# Patient Record
Sex: Male | Born: 1960 | Race: Black or African American | Hispanic: No | Smoking: Former smoker
Health system: Southern US, Community
[De-identification: ages and names within clinical notes are randomized; demographics above are authoritative.]

## PROBLEM LIST (undated history)

## (undated) DIAGNOSIS — Z92241 Personal history of systemic steroid therapy: Secondary | ICD-10-CM

## (undated) DIAGNOSIS — I10 Essential (primary) hypertension: Secondary | ICD-10-CM

## (undated) DIAGNOSIS — G4730 Sleep apnea, unspecified: Secondary | ICD-10-CM

## (undated) DIAGNOSIS — K219 Gastro-esophageal reflux disease without esophagitis: Secondary | ICD-10-CM

## (undated) HISTORY — PX: COLON SURGERY: SHX602

## (undated) HISTORY — PX: CIRCUMCISION: SUR203

---

## 2009-03-07 ENCOUNTER — Emergency Department (HOSPITAL_COMMUNITY): Admission: EM | Admit: 2009-03-07 | Discharge: 2009-03-07 | Payer: Self-pay | Admitting: Emergency Medicine

## 2009-04-15 ENCOUNTER — Emergency Department (HOSPITAL_COMMUNITY): Admission: EM | Admit: 2009-04-15 | Discharge: 2009-04-15 | Payer: Self-pay | Admitting: Emergency Medicine

## 2009-04-16 ENCOUNTER — Emergency Department (HOSPITAL_COMMUNITY): Admission: EM | Admit: 2009-04-16 | Discharge: 2009-04-16 | Payer: Self-pay | Admitting: Emergency Medicine

## 2010-07-28 LAB — COMPREHENSIVE METABOLIC PANEL
ALT: 31 U/L (ref 0–53)
AST: 27 U/L (ref 0–37)
Albumin: 3.5 g/dL (ref 3.5–5.2)
Calcium: 8.3 mg/dL — ABNORMAL LOW (ref 8.4–10.5)
Creatinine, Ser: 1.14 mg/dL (ref 0.4–1.5)
GFR calc Af Amer: >60 mL/min (ref >60)
GFR calc non Af Amer: >60 mL/min (ref >60)
Sodium: 137 mEq/L (ref 135–145)
Total Protein: 6.3 g/dL (ref 6.0–8.3)

## 2010-07-28 LAB — STREP A DNA PROBE: Group A Strep Probe: NEGATIVE

## 2010-07-28 LAB — DIFFERENTIAL
Eosinophils Relative: 0 % (ref 0–5)
Lymphocytes Relative: 4 % — ABNORMAL LOW (ref 12–46)
Lymphs Abs: 0.3 10*3/uL — ABNORMAL LOW (ref 0.7–4.0)
Monocytes Absolute: 0.9 10*3/uL (ref 0.1–1.0)
Monocytes Relative: 11 % (ref 3–12)

## 2010-07-28 LAB — CBC
MCHC: 34.6 g/dL (ref 30.0–36.0)
MCV: 84.6 fL (ref 78.0–100.0)
Platelets: 187 10*3/uL (ref 150–400)
RDW: 14.7 % (ref 11.5–15.5)

## 2010-07-30 LAB — URINALYSIS, ROUTINE W REFLEX MICROSCOPIC
Glucose, UA: NEGATIVE mg/dL
Hgb urine dipstick: NEGATIVE
Ketones, ur: NEGATIVE mg/dL
pH: 5.5 (ref 5.0–8.0)

## 2010-11-08 IMAGING — CR DG CHEST 2V
2 series · 2 of 2 positions shown · non-contrast
Comparison: 04/15/2009

CLINICAL DATA: Fever, diaphoresis, sore throat and tachycardia.
Pleuritic chest pain.

CHEST - 2 VIEW

[w chest pa]
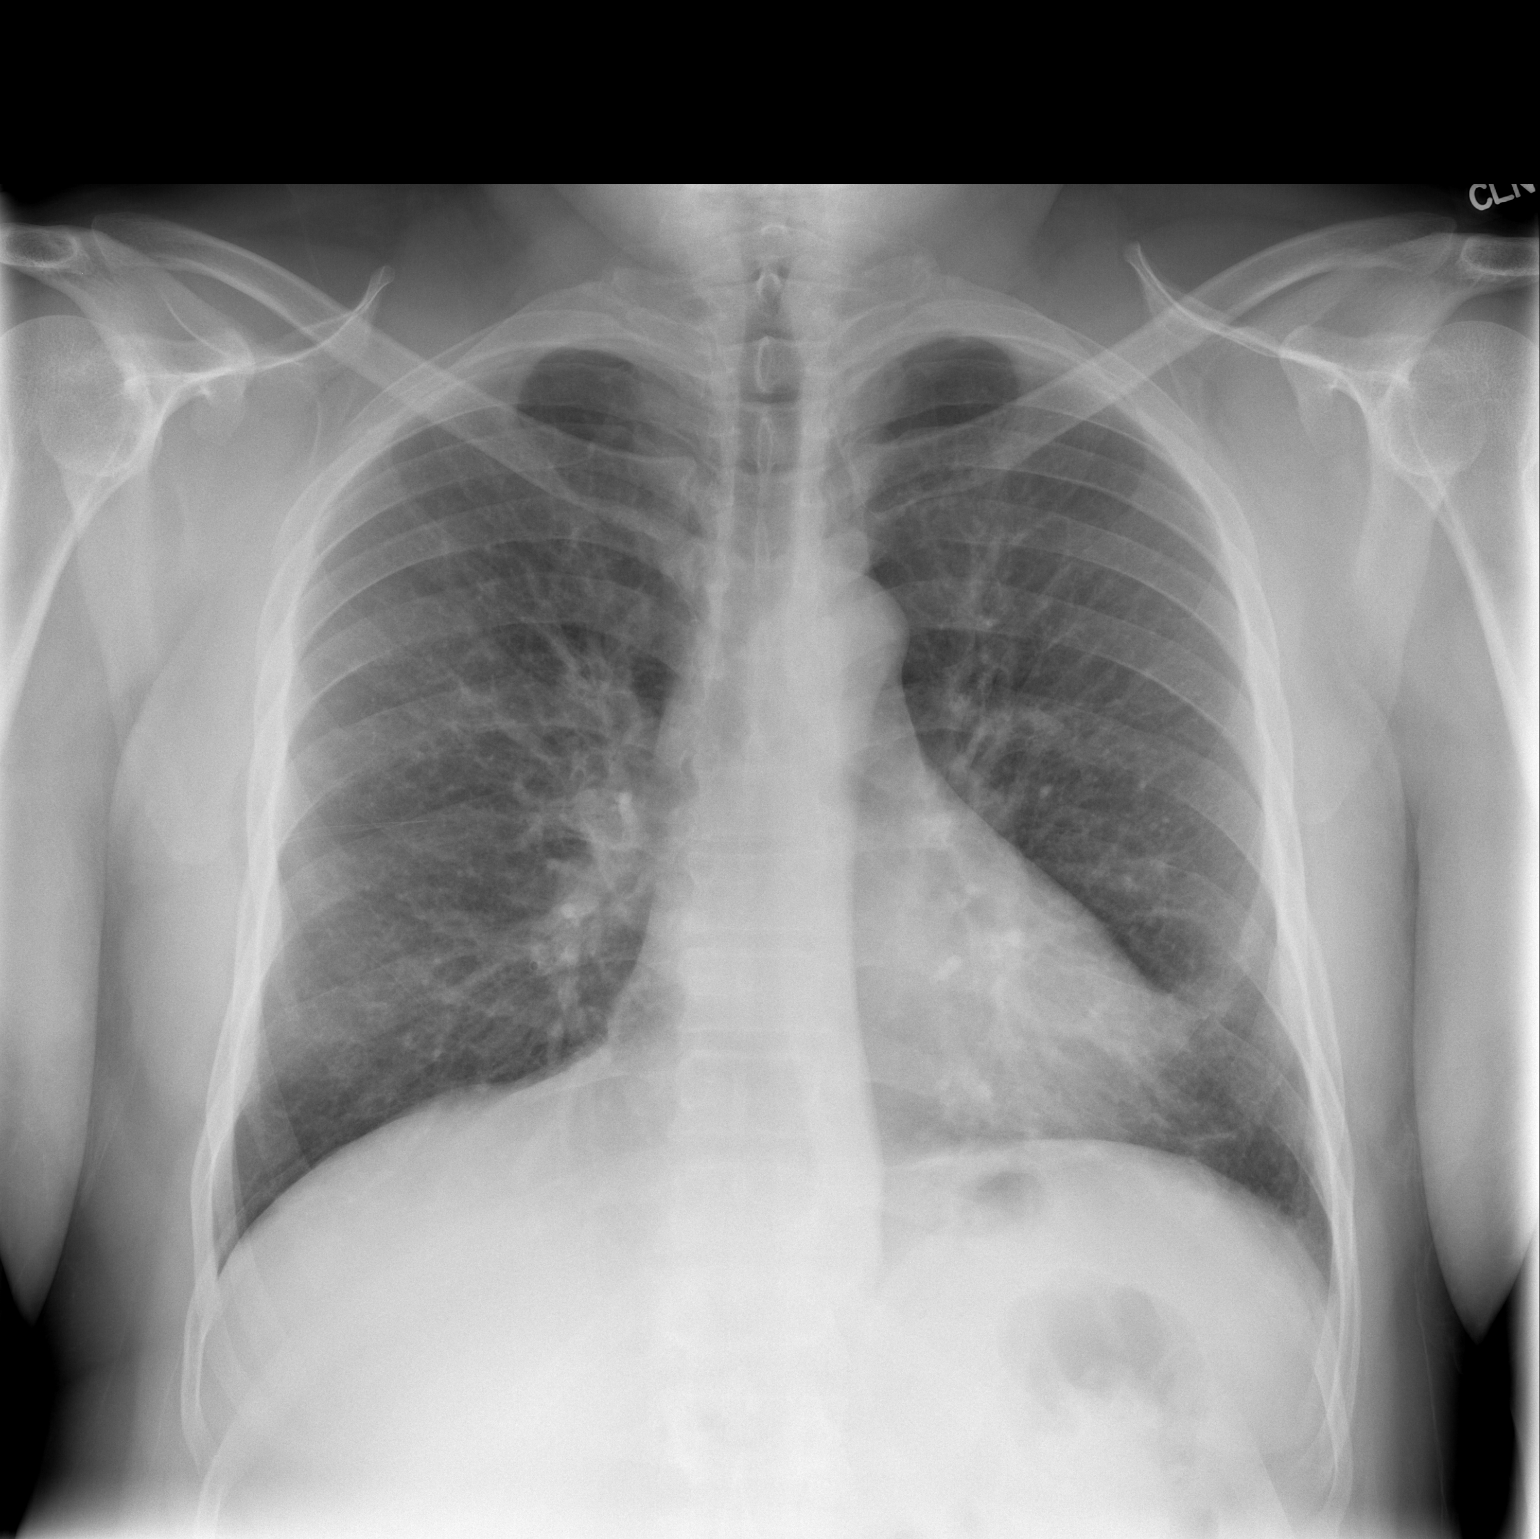

[w chest lat]
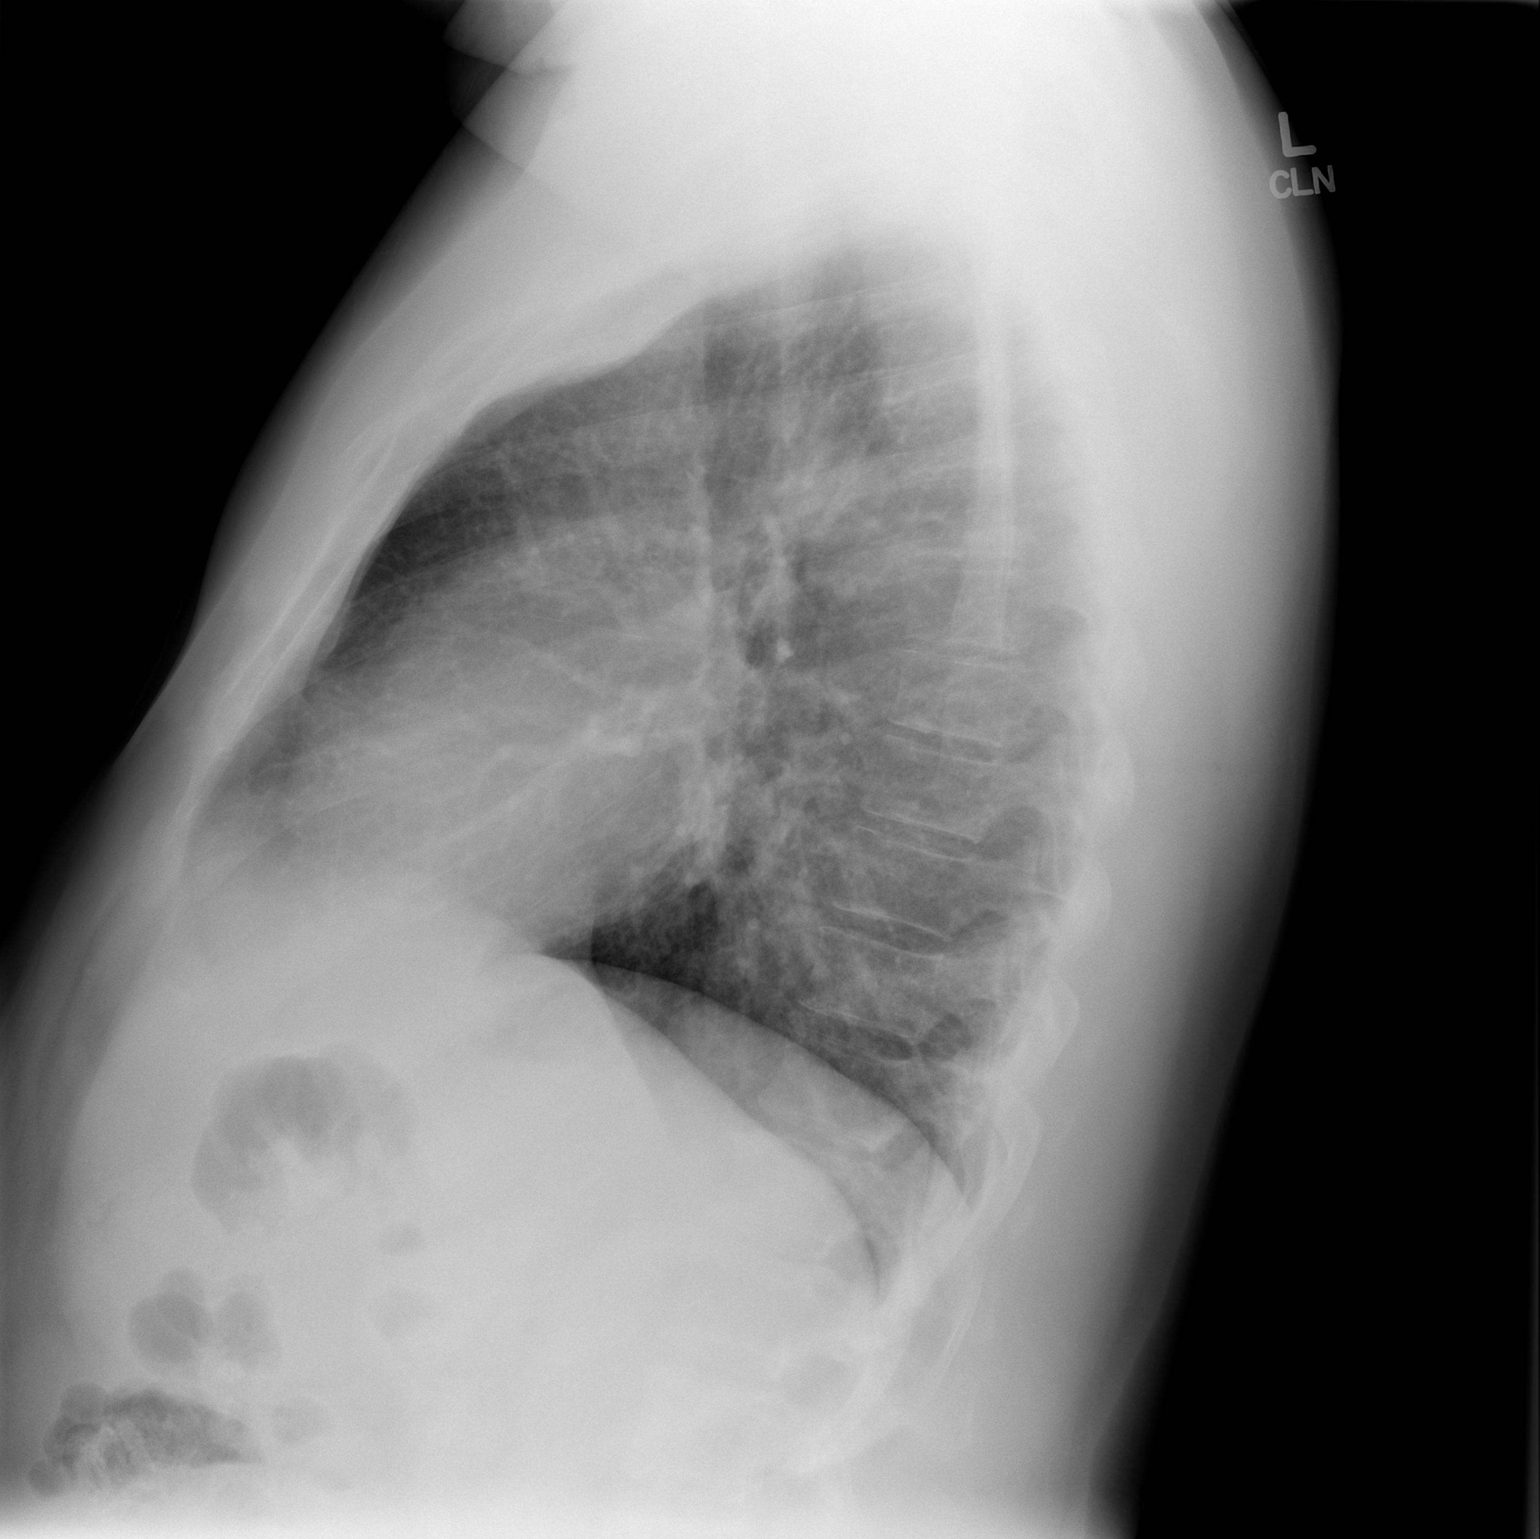

[2 of 2 positions shown; findings below may reference images not displayed]

FINDINGS: Trachea is midline.  Heart size normal.  Very mild
interstitial prominence may be due to slightly lower lung volumes
than on yesterday's exam.  No pleural fluid.
IMPRESSION: Very mild interstitial prominence may be due to vascular crowding.
A viral or atypical infectious process cannot be excluded.

## 2012-11-03 ENCOUNTER — Emergency Department (HOSPITAL_COMMUNITY): Payer: No Typology Code available for payment source

## 2012-11-03 ENCOUNTER — Encounter (HOSPITAL_COMMUNITY): Payer: Self-pay | Admitting: Emergency Medicine

## 2012-11-03 ENCOUNTER — Emergency Department (HOSPITAL_COMMUNITY)
Admission: EM | Admit: 2012-11-03 | Discharge: 2012-11-03 | Disposition: A | Discharge status: Home / Self Care | Payer: No Typology Code available for payment source | Attending: Emergency Medicine | Admitting: Emergency Medicine

## 2012-11-03 DIAGNOSIS — M25531 Pain in right wrist: Secondary | ICD-10-CM

## 2012-11-03 DIAGNOSIS — S59909A Unspecified injury of unspecified elbow, initial encounter: Secondary | ICD-10-CM | POA: Insufficient documentation

## 2012-11-03 DIAGNOSIS — S6990XA Unspecified injury of unspecified wrist, hand and finger(s), initial encounter: Secondary | ICD-10-CM | POA: Insufficient documentation

## 2012-11-03 MED ORDER — IBUPROFEN 600 MG PO TABS: 600.0000 mg | ORAL_TABLET | Freq: Four times a day (QID) | ORAL | Status: DC | PRN
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
## 2012-11-03 NOTE — Progress Notes (Signed)
Patient confirmed that he does not currently have a pcp, however he does have an appointment with [facility] on 07/24.

## 2012-11-06 NOTE — ED Provider Notes (Signed)
Medical screening examination/treatment/procedure(s) were performed by non-physician practitioner and as supervising physician I was immediately available for consultation/collaboration.   Henley Blyth L Ritamarie Arkin, MD 11/06/12 1108 

## 2013-01-14 ENCOUNTER — Encounter (HOSPITAL_COMMUNITY): Payer: Self-pay | Admitting: Emergency Medicine

## 2013-01-14 ENCOUNTER — Emergency Department (HOSPITAL_COMMUNITY)
Admission: EM | Admit: 2013-01-14 | Discharge: 2013-01-14 | Disposition: A | Discharge status: Home / Self Care | Payer: No Typology Code available for payment source | Attending: Emergency Medicine | Admitting: Emergency Medicine

## 2013-01-14 DIAGNOSIS — H5789 Other specified disorders of eye and adnexa: Secondary | ICD-10-CM | POA: Insufficient documentation

## 2013-01-14 DIAGNOSIS — H2000 Unspecified acute and subacute iridocyclitis: Secondary | ICD-10-CM | POA: Insufficient documentation

## 2013-01-14 DIAGNOSIS — H539 Unspecified visual disturbance: Secondary | ICD-10-CM | POA: Insufficient documentation

## 2013-01-14 DIAGNOSIS — X58XXXA Exposure to other specified factors, initial encounter: Secondary | ICD-10-CM | POA: Insufficient documentation

## 2013-01-14 DIAGNOSIS — S058X9A Other injuries of unspecified eye and orbit, initial encounter: Secondary | ICD-10-CM | POA: Insufficient documentation

## 2013-01-14 DIAGNOSIS — Y939 Activity, unspecified: Secondary | ICD-10-CM | POA: Insufficient documentation

## 2013-01-14 DIAGNOSIS — S0502XA Injury of conjunctiva and corneal abrasion without foreign body, left eye, initial encounter: Secondary | ICD-10-CM

## 2013-01-14 DIAGNOSIS — Y92009 Unspecified place in unspecified non-institutional (private) residence as the place of occurrence of the external cause: Secondary | ICD-10-CM | POA: Insufficient documentation

## 2013-01-14 DIAGNOSIS — H53149 Visual discomfort, unspecified: Secondary | ICD-10-CM | POA: Insufficient documentation

## 2013-01-14 MED ORDER — PROPARACAINE HCL 0.5 % OP SOLN
1.0000 [drp] | Freq: Once | OPHTHALMIC | Status: AC
  Administered 2013-01-14: 1 [drp] via OPHTHALMIC
  Filled 2013-01-14: qty 15

## 2013-01-14 MED ORDER — CYCLOPENTOLATE HCL 1 % OP SOLN: 1.0000 [drp] | Freq: Once | OPHTHALMIC | Status: DC

## 2013-01-14 MED ORDER — OXYCODONE-ACETAMINOPHEN 5-325 MG PO TABS: ORAL_TABLET | ORAL | Status: DC

## 2013-01-14 MED ORDER — GENTAMICIN SULFATE 0.3 % OP SOLN: 1.0000 [drp] | OPHTHALMIC | Status: DC

## 2013-01-14 MED ORDER — FLUORESCEIN SODIUM 1 MG OP STRP
1.0000 | ORAL_STRIP | Freq: Once | OPHTHALMIC | Status: AC
  Administered 2013-01-14: 08:00:00 via OPHTHALMIC
  Filled 2013-01-14: qty 1

## 2013-01-14 NOTE — ED Notes (Signed)
Pt from home, reports that he woke up with L eye pain and irritation. Pt states when he went to bed it was fine. Pt has redness, tearing and pain to L eye. Pt is A&O and in NAD. Pt denies any other c/o

## 2013-01-14 NOTE — ED Provider Notes (Addendum)
Medical screening examination/treatment/procedure(s) were conducted as a shared visit with non-physician practitioner(s) and myself.  I personally evaluated the patient during the encounter  Pt with acute left eye pain, fluorescein uptake.  No other rash, no ulcers in mouth, face, no h/o shingles.  Pt is not diabetic.  Significant pain improvement with topical anesthetic.  Pt did have pain in left eye with light in right eye suggesting pain is from acute iritis.  Will need ophthalmology referral for Monday, abx drops, pain control.    Francis Coleman. Oletta Lamas, MD 01/14/13 [id number]  Francis Coleman. Oletta Lamas, MD 01/14/13 [phone]
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
## 2013-02-02 ENCOUNTER — Encounter: Payer: Self-pay | Admitting: Podiatry

## 2013-02-02 ENCOUNTER — Ambulatory Visit (INDEPENDENT_AMBULATORY_CARE_PROVIDER_SITE_OTHER): Payer: No Typology Code available for payment source

## 2013-02-02 ENCOUNTER — Ambulatory Visit (INDEPENDENT_AMBULATORY_CARE_PROVIDER_SITE_OTHER): Payer: No Typology Code available for payment source | Admitting: Podiatry

## 2013-02-02 VITALS — BP 136/80 | HR 78 | Resp 12 | Ht 67.0 in | Wt 189.0 lb

## 2013-02-02 DIAGNOSIS — B351 Tinea unguium: Secondary | ICD-10-CM

## 2013-02-02 DIAGNOSIS — M2040 Other hammer toe(s) (acquired), unspecified foot: Secondary | ICD-10-CM

## 2013-02-02 DIAGNOSIS — R52 Pain, unspecified: Secondary | ICD-10-CM

## 2013-02-02 MED ORDER — TERBINAFINE HCL 250 MG PO TABS: 250.0000 mg | ORAL_TABLET | Freq: Every day | ORAL | Status: DC

## 2013-02-02 NOTE — Progress Notes (Signed)
Subjective:     Patient ID: Francis Coleman, male   DOB: 1961/01/16, 52 y.o.   MRN: [id number]  HPI my nails are discolored especially my left 1 states that he has developed thickness of the nailbed and it can become discomfort. Also complains about corns on his little toes. They state they become sore.  Review of Systems  All other systems reviewed and are negative.       Objective:   Physical Exam  Nursing note and vitals reviewed. Cardiovascular: Intact distal pulses.   Musculoskeletal: Normal range of motion.  Neurological: He is alert.  Skin: Skin is warm.   thickness of the nail bed hallux left over right. Other nails have some mild discoloration. Severe keratotic lesions with pain fifth toes bilateral head of proximal phalanx.    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    Plan:     H&P performed x-rays reviewed discussed the nail condition and disease and discussed treatment options. Patient has opted for oral therapy and I did discuss Lamisil and its risks he did have blood work done and his liver function was fine. He will begin Lamisil 250 daily for 90 days no refills. Discussed toes I have recommended arthroplasty fifth digit bilateral and I educated him on this. He wants to have this done we will discussed in greater detail 4 weeks.

## 2013-02-02 NOTE — Progress Notes (Signed)
°  Subjective:    Patient ID: Francis Coleman, male    DOB: 06/01/1960, 52 y.o.   MRN: [id number]  HPI Comments: N DICOLORATION, SORE L  LT FOOT GREAT TOE D  8 MONTHS O SLOWLY C  WORSE A  PRESSURE T  NONE      Review of Systems     Objective:   Physical Exam        Assessment & Plan:

## 2013-02-02 NOTE — Patient Instructions (Signed)
Reappoint in 4 weeks to discuss fixing corns on 5th toes. Take 1 pill a day for 90 days

## 2013-03-02 ENCOUNTER — Ambulatory Visit: Payer: No Typology Code available for payment source | Admitting: Podiatry

## 2013-03-15 ENCOUNTER — Ambulatory Visit: Payer: No Typology Code available for payment source | Admitting: Podiatry

## 2013-06-11 ENCOUNTER — Encounter (HOSPITAL_COMMUNITY): Payer: Self-pay | Admitting: Emergency Medicine

## 2013-06-11 ENCOUNTER — Emergency Department (HOSPITAL_COMMUNITY): Payer: No Typology Code available for payment source

## 2013-06-11 ENCOUNTER — Emergency Department (HOSPITAL_COMMUNITY)
Admission: EM | Admit: 2013-06-11 | Discharge: 2013-06-11 | Disposition: A | Discharge status: Home / Self Care | Payer: No Typology Code available for payment source | Attending: Emergency Medicine | Admitting: Emergency Medicine

## 2013-06-11 DIAGNOSIS — M79609 Pain in unspecified limb: Secondary | ICD-10-CM | POA: Insufficient documentation

## 2013-06-11 DIAGNOSIS — R059 Cough, unspecified: Secondary | ICD-10-CM | POA: Insufficient documentation

## 2013-06-11 DIAGNOSIS — Z87891 Personal history of nicotine dependence: Secondary | ICD-10-CM | POA: Insufficient documentation

## 2013-06-11 DIAGNOSIS — R05 Cough: Secondary | ICD-10-CM | POA: Insufficient documentation

## 2013-06-11 DIAGNOSIS — R079 Chest pain, unspecified: Secondary | ICD-10-CM | POA: Insufficient documentation

## 2013-06-11 LAB — BASIC METABOLIC PANEL
BUN: 20 mg/dL (ref 6–23)
CO2: 23 meq/L (ref 19–32)
Calcium: 9.1 mg/dL (ref 8.4–10.5)
Chloride: 102 mEq/L (ref 96–112)
Creatinine, Ser: 1.09 mg/dL (ref 0.50–1.35)
GFR calc Af Amer: 88 mL/min — ABNORMAL LOW (ref >90)
GFR calc non Af Amer: 76 mL/min — ABNORMAL LOW (ref >90)
GLUCOSE: 91 mg/dL (ref 70–99)
POTASSIUM: 4.1 meq/L (ref 3.7–5.3)
SODIUM: 139 meq/L (ref 137–147)

## 2013-06-11 LAB — CBC
HCT: 39.6 % (ref 39.0–52.0)
Hemoglobin: 13.3 g/dL (ref 13.0–17.0)
MCH: 28 pg (ref 26.0–34.0)
MCHC: 33.6 g/dL (ref 30.0–36.0)
MCV: 83.4 fL (ref 78.0–100.0)
Platelets: 280 10*3/uL (ref 150–400)
RBC: 4.75 MIL/uL (ref 4.22–5.81)
RDW: 15 % (ref 11.5–15.5)
WBC: 7.5 10*3/uL (ref 4.0–10.5)

## 2013-06-11 LAB — POCT I-STAT TROPONIN I: TROPONIN I, POC: 0.01 ng/mL (ref 0.00–0.08)

## 2013-06-11 LAB — D-DIMER, QUANTITATIVE (NOT AT ARMC)

## 2013-06-11 MED ORDER — OXYCODONE-ACETAMINOPHEN 5-325 MG PO TABS: 1.0000 | ORAL_TABLET | Freq: Four times a day (QID) | ORAL | Status: DC | PRN

## 2013-06-11 MED ORDER — IBUPROFEN 600 MG PO TABS: 600.0000 mg | ORAL_TABLET | Freq: Four times a day (QID) | ORAL | Status: DC | PRN

## 2013-06-11 NOTE — ED Provider Notes (Signed)
CSN: [id number]     Arrival date & time 06/11/13  1313 History   First MD Initiated Contact with Patient 06/11/13 1410     Chief Complaint  Patient presents with  . Cough  . Chest Pain  . Arm Pain     (Consider location/radiation/quality/duration/timing/severity/associated sxs/prior Treatment) Patient is a 53 y.o. male presenting with cough, chest pain, and arm pain. The history is provided by the patient.  Cough Associated symptoms: chest pain   Associated symptoms: no headaches, no rash and no shortness of breath   Chest Pain Associated symptoms: cough   Associated symptoms: no abdominal pain, no back pain, no dysphagia, no headache, no nausea, no numbness, no shortness of breath, not vomiting and no weakness   Arm Pain Associated symptoms include chest pain. Pertinent negatives include no abdominal pain, no headaches and no shortness of breath.   patient presents with right-sided chest pain the last few days. He's also had a cough. He states he's had some mild sputum production, but is also had blood. He states his blood mixed with sputum and sputum mixed with blood. No fevers. 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 The pain is worse with movement and deep breathing. He does not smoke. He also states he has some pain in his upper arm on the right side. No trauma. No different physical activities.  History reviewed. No pertinent past medical history. History reviewed. No pertinent past surgical history. History reviewed. No pertinent family history. History  Substance Use Topics  . Smoking status: Former Games developer  . Smokeless tobacco: Not on file  . Alcohol Use: Yes     Comment: socially    Review of Systems  Constitutional: Negative for activity change and appetite change.  HENT: Negative for trouble swallowing.   Eyes: Negative for pain.  Respiratory: Positive for cough. Negative for chest tightness and shortness of breath.   Cardiovascular: Positive for  chest pain. Negative for leg swelling.  Gastrointestinal: Negative for nausea, vomiting, abdominal pain and diarrhea.  Genitourinary: Negative for flank pain.  Musculoskeletal: Negative for back pain and neck stiffness.  Skin: Negative for rash.  Neurological: Negative for weakness, numbness and headaches.  Psychiatric/Behavioral: Negative for behavioral problems.      Allergies  Review of patient's allergies indicates no known allergies.  Home Medications   Current Outpatient Rx  Name  Route  Sig  Dispense  Refill  . ibuprofen (ADVIL,MOTRIN) 600 MG tablet   Oral   Take 1 tablet (600 mg total) by mouth every 6 (six) hours as needed.   20 tablet   0   . oxyCODONE-acetaminophen (PERCOCET/ROXICET) 5-325 MG per tablet   Oral   Take 1-2 tablets by mouth every 6 (six) hours as needed for severe pain.   10 tablet   0    BP 145/89  Pulse 73  Temp(Src) 97.8 F (36.6 C) (Oral)  Resp 20  SpO2 99% Physical Exam  Nursing note and vitals reviewed. Constitutional: He is oriented to person, place, and time. He appears well-developed and well-nourished.  HENT:  Head: Normocephalic and atraumatic.  Eyes: EOM are normal. Pupils are equal, round, and reactive to light.  Neck: Normal range of motion. Neck supple.  Cardiovascular: Normal rate, regular rhythm and normal heart sounds.   No murmur heard. Pulmonary/Chest: Effort normal and breath sounds normal. He exhibits tenderness.  Mild tenderness to right anterior lower chest wall and sternal area. No rash. No crepitance or deformity.  Abdominal: Soft. Bowel sounds are normal. He exhibits no distension and no mass. There is no tenderness. There is no rebound and no guarding.  Musculoskeletal: Normal range of motion. He exhibits tenderness. He exhibits no edema.  Mild tenderness to right medial upper arm. No swelling. Neurovascular intact distally.  Neurological: He is alert and oriented to person, place, and time. No cranial nerve  deficit.  Skin: Skin is warm and dry.  Psychiatric: He has a normal mood and affect.    ED Course  Procedures (including critical care time) Labs Review Labs Reviewed  BASIC METABOLIC PANEL - Abnormal; Notable for the following:    GFR calc non Af Amer 76 (*)    GFR calc Af Amer 88 (*)    All other components within normal limits  CBC  D-DIMER, QUANTITATIVE  POCT I-STAT TROPONIN I   Imaging Review Dg Chest 2 View (if Patient Has Fever And/or Copd)  06/11/2013   CLINICAL DATA:  Right chest pain and productive cough for 4 days.  EXAM: CHEST  2 VIEW  COMPARISON:  PA and lateral chest 04/16/2009 and 04/15/2009.  FINDINGS: Nipple shadows are noted. Lungs are clear. Heart size is normal. No pneumothorax or pleural effusion.  IMPRESSION: No acute disease.   Electronically Signed   By: Drusilla Kannerhomas  Dalessio M.D.   On: 06/11/2013 13:57    EKG Interpretation    Date/Time:  Sunday June 11 2013 13:23:01 EST Ventricular Rate:  70 PR Interval:  195 QRS Duration: 72 QT Interval:  345 QTC Calculation: 372 R Axis:   82 Text Interpretation:  Sinus rhythm Probable left ventricular hypertrophy Confirmed by Rubin PayorPICKERING  MD, Johncarlos Holtsclaw ([id number]) on 06/11/2013 2:16:38 PM            MDM   Final diagnoses:  Chest pain    Patient with chest pain. EKG reassuring. Cardiac enzymes negative. X-ray reassuring. 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 Will discharge home.    Juliet RudeNathan R. Rubin PayorPickering, MD 06/11/13 [phone]

## 2013-06-11 NOTE — Discharge Instructions (Signed)
Chest Pain (Nonspecific) °It is often hard to give a specific diagnosis for the cause of chest pain. There is always a chance that your pain could be related to something serious, such as a heart attack or a blood clot in the lungs. You need to follow up with your caregiver for further evaluation. °CAUSES  °· Heartburn. °· Pneumonia or bronchitis. °· Anxiety or stress. °· Inflammation around your heart (pericarditis) or lung (pleuritis or pleurisy). °· A blood clot in the lung. °· A collapsed lung (pneumothorax). It can develop suddenly on its own (spontaneous pneumothorax) or from injury (trauma) to the chest. °· Shingles infection (herpes zoster virus). °The chest wall is composed of bones, muscles, and cartilage. Any of these can be the source of the pain. °· The bones can be bruised by injury. °· The muscles or cartilage can be strained by coughing or overwork. °· The cartilage can be affected by inflammation and become sore (costochondritis). °DIAGNOSIS  °Lab tests or other studies, such as X-rays, electrocardiography, stress testing, or cardiac imaging, may be needed to find the cause of your pain.  °TREATMENT  °· Treatment depends on what may be causing your chest pain. Treatment may include: °· Acid blockers for heartburn. °· Anti-inflammatory medicine. °· Pain medicine for inflammatory conditions. °· Antibiotics if an infection is present. °· You may be advised to change lifestyle habits. This includes stopping smoking and avoiding alcohol, caffeine, and chocolate. °· You may be advised to keep your head raised (elevated) when sleeping. This reduces the chance of acid going backward from your stomach into your esophagus. °· Most of the time, nonspecific chest pain will improve within 2 to 3 days with rest and mild pain medicine. °HOME CARE INSTRUCTIONS  °· If antibiotics were prescribed, take your antibiotics as directed. Finish them even if you start to feel better. °· For the next few days, avoid physical  activities that bring on chest pain. Continue physical activities as directed. °· Do not smoke. °· Avoid drinking alcohol. °· Only take over-the-counter or prescription medicine for pain, discomfort, or fever as directed by your caregiver. °· Follow your caregiver's suggestions for further testing if your chest pain does not go away. °· Keep any follow-up appointments you made. If you do not go to an appointment, you could develop lasting (chronic) problems with pain. If there is any problem keeping an appointment, you must call to reschedule. °SEEK MEDICAL CARE IF:  °· You think you are having problems from the medicine you are taking. Read your medicine instructions carefully. °· Your chest pain does not go away, even after treatment. °· You develop a rash with blisters on your chest. °SEEK IMMEDIATE MEDICAL CARE IF:  °· You have increased chest pain or pain that spreads to your arm, neck, jaw, back, or abdomen. °· You develop shortness of breath, an increasing cough, or you are coughing up blood. °· You have severe back or abdominal pain, feel nauseous, or vomit. °· You develop severe weakness, fainting, or chills. °· You have a fever. °THIS IS AN EMERGENCY. Do not wait to see if the pain will go away. Get medical help at once. Call your local emergency services (911 in [place] Do not drive yourself to the hospital. °MAKE SURE YOU:  °· Understand these instructions. °· Will watch your condition. °· Will get help right away if you are not doing well or get worse. °Document Released: 01/21/2005 Document Revised: 07/06/2011 Document Reviewed: 11/17/2007 °[employer]® Patient Information ©2014 [employer]   [employer]. ° °

## 2013-06-11 NOTE — ED Notes (Signed)
Pt states he has had R side chest pain and productive cough for past 3 days. Pt states he feels it is harder to take deep breath and a little weaker than normal, but no dyspnea. Pt alert and in no distress. Pt states he has been coughing up green phlegm mixed with some blood. Pt also states his R arm feels numb, pt able to move extremities with no difficulty, denies injury

## 2013-06-20 ENCOUNTER — Emergency Department (HOSPITAL_COMMUNITY)
Admission: EM | Admit: 2013-06-20 | Discharge: 2013-06-20 | Disposition: A | Discharge status: Home / Self Care | Payer: No Typology Code available for payment source | Attending: Emergency Medicine | Admitting: Emergency Medicine

## 2013-06-20 ENCOUNTER — Emergency Department (HOSPITAL_COMMUNITY): Payer: No Typology Code available for payment source

## 2013-06-20 ENCOUNTER — Encounter (HOSPITAL_COMMUNITY): Payer: Self-pay | Admitting: Emergency Medicine

## 2013-06-20 DIAGNOSIS — X58XXXA Exposure to other specified factors, initial encounter: Secondary | ICD-10-CM | POA: Insufficient documentation

## 2013-06-20 DIAGNOSIS — M5412 Radiculopathy, cervical region: Secondary | ICD-10-CM | POA: Insufficient documentation

## 2013-06-20 DIAGNOSIS — S46819A Strain of other muscles, fascia and tendons at shoulder and upper arm level, unspecified arm, initial encounter: Secondary | ICD-10-CM

## 2013-06-20 DIAGNOSIS — Z87891 Personal history of nicotine dependence: Secondary | ICD-10-CM | POA: Insufficient documentation

## 2013-06-20 DIAGNOSIS — S43499A Other sprain of unspecified shoulder joint, initial encounter: Secondary | ICD-10-CM | POA: Insufficient documentation

## 2013-06-20 DIAGNOSIS — Y939 Activity, unspecified: Secondary | ICD-10-CM | POA: Insufficient documentation

## 2013-06-20 DIAGNOSIS — Y929 Unspecified place or not applicable: Secondary | ICD-10-CM | POA: Insufficient documentation

## 2013-06-20 LAB — CBC WITH DIFFERENTIAL/PLATELET
Basophils Absolute: 0.1 10*3/uL (ref 0.0–0.1)
Basophils Relative: 1 % (ref 0–1)
Eosinophils Absolute: 0.2 10*3/uL (ref 0.0–0.7)
Eosinophils Relative: 2 % (ref 0–5)
HCT: 41.1 % (ref 39.0–52.0)
HEMOGLOBIN: 13.6 g/dL (ref 13.0–17.0)
LYMPHS ABS: 1.8 10*3/uL (ref 0.7–4.0)
Lymphocytes Relative: 25 % (ref 12–46)
MCH: 27.9 pg (ref 26.0–34.0)
MCHC: 33.1 g/dL (ref 30.0–36.0)
MCV: 84.4 fL (ref 78.0–100.0)
MONOS PCT: 11 % (ref 3–12)
Monocytes Absolute: 0.8 10*3/uL (ref 0.1–1.0)
NEUTROS ABS: 4.5 10*3/uL (ref 1.7–7.7)
NEUTROS PCT: 61 % (ref 43–77)
PLATELETS: 257 10*3/uL (ref 150–400)
RBC: 4.87 MIL/uL (ref 4.22–5.81)
RDW: 15.3 % (ref 11.5–15.5)
WBC: 7.3 10*3/uL (ref 4.0–10.5)

## 2013-06-20 LAB — TROPONIN I: Troponin I: <0.3 ng/mL (ref <0.30)

## 2013-06-20 LAB — BASIC METABOLIC PANEL
BUN: 19 mg/dL (ref 6–23)
CHLORIDE: 105 meq/L (ref 96–112)
CO2: 25 mEq/L (ref 19–32)
Calcium: 9.5 mg/dL (ref 8.4–10.5)
Creatinine, Ser: 1.19 mg/dL (ref 0.50–1.35)
GFR calc non Af Amer: 69 mL/min — ABNORMAL LOW (ref >90)
GFR, EST AFRICAN AMERICAN: 80 mL/min — AB (ref >90)
Glucose, Bld: 100 mg/dL — ABNORMAL HIGH (ref 70–99)
Potassium: 4.5 mEq/L (ref 3.7–5.3)
SODIUM: 142 meq/L (ref 137–147)

## 2013-06-20 MED ORDER — PREDNISONE 20 MG PO TABS: ORAL_TABLET | ORAL | Status: DC

## 2013-06-20 MED ORDER — DEXAMETHASONE SODIUM PHOSPHATE 10 MG/ML IJ SOLN: 10.0000 mg | Freq: Once | INTRAMUSCULAR | Status: DC

## 2013-06-20 MED ORDER — DEXAMETHASONE SODIUM PHOSPHATE 4 MG/ML IJ SOLN
10.0000 mg | Freq: Once | INTRAMUSCULAR | Status: DC
  Administered 2013-06-20: 10 mg via INTRAVENOUS
  Filled 2013-06-20: qty 3

## 2013-06-20 MED ORDER — SODIUM CHLORIDE 0.9 % IV SOLN
INTRAVENOUS | Status: DC
  Administered 2013-06-20: 18:00:00 via INTRAVENOUS

## 2013-06-20 MED ORDER — CYCLOBENZAPRINE HCL 10 MG PO TABS: 10.0000 mg | ORAL_TABLET | Freq: Three times a day (TID) | ORAL | Status: DC | PRN

## 2013-06-20 MED ORDER — KETOROLAC TROMETHAMINE 15 MG/ML IJ SOLN
30.0000 mg | Freq: Once | INTRAMUSCULAR | Status: AC
  Administered 2013-06-20: 30 mg via INTRAVENOUS
  Filled 2013-06-20: qty 2

## 2013-06-20 MED ORDER — NAPROXEN SODIUM 550 MG PO TABS: 550.0000 mg | ORAL_TABLET | Freq: Two times a day (BID) | ORAL | Status: DC

## 2013-06-20 MED ORDER — DIAZEPAM 5 MG/ML IJ SOLN
5.0000 mg | Freq: Once | INTRAMUSCULAR | Status: AC
  Administered 2013-06-20: 5 mg via INTRAVENOUS
  Filled 2013-06-20: qty 2

## 2013-06-20 NOTE — ED Provider Notes (Signed)
CSN: [id number]     Arrival date & time 06/20/13  1618 History   First MD Initiated Contact with Patient 06/20/13 1635     Chief Complaint  Patient presents with  . Chest Pain     (Consider location/radiation/quality/duration/timing/severity/associated sxs/prior Treatment) HPI Patient reports she started having right-sided chest pain about 2 weeks ago. He states the pain radiates down his right arm into his fingers and into his right posterior shoulder blade area. He states the pain burns and sometimes it's aching. He reports the pain has been constant for the past 4 days. He also thinks today he has swelling in his right chest. He denies pain in his neck. He states the pain is worse with movement of his head in his arm. He denies any known injury or change in activity. 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 He states he is right-handed. He denies shortness of breath, nausea, vomiting, and or diaphoresis. He denies cough but states once when he coughed he saw a small bit of blood when his stent. He has had a normal appetite. Patient was seen in the ED on 2:15 with complaints of cough and right-sided chest pain. He had a normal chest x-ray at that time. He was discharged with Percocet which he states does not help his pain at all. He states his current pain is a 10 out of 10. He says repeatedly that he has pain "down to the bone".  PCP Dr Claiborne Billings [facility]  History reviewed. No pertinent past medical history. History reviewed. No pertinent past surgical history. No family history on file. History  Substance Use Topics  . Smoking status: Former Games developer  . Smokeless tobacco: Not on file  . Alcohol Use: Yes     Comment: socially  employed Quit smoking in 1999  Review of Systems  All other systems reviewed and are negative.      Allergies  Review of patient's allergies indicates no known allergies.  Home Medications   Current Outpatient Rx  Name   Route  Sig  Dispense  Refill  . ibuprofen (ADVIL,MOTRIN) 600 MG tablet   Oral   Take 1 tablet (600 mg total) by mouth every 6 (six) hours as needed.   20 tablet   0   . oxyCODONE-acetaminophen (PERCOCET/ROXICET) 5-325 MG per tablet   Oral   Take 1-2 tablets by mouth every 6 (six) hours as needed for severe pain.   10 tablet   0    BP 135/74  Pulse 80  Temp(Src) 97.9 F (36.6 C) (Oral)  Resp 20  Ht 5\' 6"  (1.676 m)  Wt 183 lb (83.008 kg)  BMI 29.55 kg/m2  SpO2 100%  Vital signs normal   Physical Exam  Nursing note and vitals reviewed. Constitutional: He is oriented to person, place, and time. He appears well-developed and well-nourished.  Non-toxic appearance. He does not appear ill. No distress.  HENT:  Head: Normocephalic and atraumatic.  Right Ear: External ear normal.  Left Ear: External ear normal.  Nose: Nose normal. No mucosal edema or rhinorrhea.  Mouth/Throat: Oropharynx is clear and moist and mucous membranes are normal. No dental abscesses or uvula swelling.  Eyes: Conjunctivae and EOM are normal. Pupils are equal, round, and reactive to light.  Neck: Normal range of motion and full passive range of motion without pain. Neck supple.  Cardiovascular: Normal rate, regular rhythm and normal heart sounds.  Exam reveals no gallop and  no friction rub.   No murmur heard. Pulmonary/Chest: Effort normal and breath sounds normal. No respiratory distress. He has no wheezes. He has no rhonchi. He has no rales.   He exhibits tenderness. He exhibits no crepitus.    Patient is very tender even to pressure even  from stethoscope in his right chest. Although he says he feels like he has swelling in his right lateral chest it appears symmetrical to me compared to the left and I do not feel any subcutaneous masses or fullness. He's also tender diffusely in his right trapezius muscle. He has no tenderness in the actual midline cervical spine. However he also reports turning his head  to the left makes him have discomfort in his right trapezius area.  Abdominal: Soft. Normal appearance and bowel sounds are normal. He exhibits no distension. There is no tenderness. There is no rebound and no guarding.  Musculoskeletal: Normal range of motion. He exhibits no edema and no tenderness.  Moves all extremities well. Patient has pain on abduction at 90 in his right shoulder.  Neurological: He is alert and oriented to person, place, and time. He has normal strength. No cranial nerve deficit.  Skin: Skin is warm, dry and intact. No rash noted. No erythema. No pallor.  Psychiatric: He has a normal mood and affect. His speech is normal and behavior is normal. His mood appears not anxious.    ED Course  Procedures (including critical care time)  Medications  0.9 %  sodium chloride infusion ( Intravenous New Bag/Given 06/20/13 1736)  dexamethasone (DECADRON) injection 10 mg (not administered)  ketorolac (TORADOL) 15 MG/ML injection 30 mg (30 mg Intravenous Given 06/20/13 1736)  diazepam (VALIUM) injection 5 mg (5 mg Intravenous Given 06/20/13 1736)   Pt given results of his tests. Will refer to neurosurgery and orthopedics.   Results for orders placed during the hospital encounter of 06/20/13  CBC WITH DIFFERENTIAL      Result Value Ref Range   WBC 7.3  4.0 - 10.5 K/uL   RBC 4.87  4.22 - 5.81 MIL/uL   Hemoglobin 13.6  13.0 - 17.0 g/dL   HCT 16.141.1  09.639.0 - 04.552.0 %   MCV 84.4  78.0 - 100.0 fL   MCH 27.9  26.0 - 34.0 pg   MCHC 33.1  30.0 - 36.0 g/dL   RDW 40.915.3  81.111.5 - 91.415.5 %   Platelets 257  150 - 400 K/uL   Neutrophils Relative % 61  43 - 77 %   Neutro Abs 4.5  1.7 - 7.7 K/uL   Lymphocytes Relative 25  12 - 46 %   Lymphs Abs 1.8  0.7 - 4.0 K/uL   Monocytes Relative 11  3 - 12 %   Monocytes Absolute 0.8  0.1 - 1.0 K/uL   Eosinophils Relative 2  0 - 5 %   Eosinophils Absolute 0.2  0.0 - 0.7 K/uL   Basophils Relative 1  0 - 1 %   Basophils Absolute 0.1  0.0 - 0.1 K/uL  BASIC  METABOLIC PANEL      Result Value Ref Range   Sodium 142  137 - 147 mEq/L   Potassium 4.5  3.7 - 5.3 mEq/L   Chloride 105  96 - 112 mEq/L   CO2 25  19 - 32 mEq/L   Glucose, Bld 100 (*) 70 - 99 mg/dL   BUN 19  6 - 23 mg/dL   Creatinine, Ser 7.821.19  0.50 - 1.35 mg/dL  Calcium 9.5  8.4 - 10.5 mg/dL   GFR calc non Af Amer 69 (*) >90 mL/min   GFR calc Af Amer 80 (*) >90 mL/min  TROPONIN I      Result Value Ref Range   Troponin I <0.30  <0.30 ng/mL   Laboratory interpretation all normal    Labs Review Results for orders placed during the hospital encounter of 06/11/13  CBC      Result Value Ref Range   WBC 7.5  4.0 - 10.5 K/uL   RBC 4.75  4.22 - 5.81 MIL/uL   Hemoglobin 13.3  13.0 - 17.0 g/dL   HCT 41.3  24.4 - 01.0 %   MCV 83.4  78.0 - 100.0 fL   MCH 28.0  26.0 - 34.0 pg   MCHC 33.6  30.0 - 36.0 g/dL   RDW 27.2  53.6 - 64.4 %   Platelets 280  150 - 400 K/uL  BASIC METABOLIC PANEL      Result Value Ref Range   Sodium 139  137 - 147 mEq/L   Potassium 4.1  3.7 - 5.3 mEq/L   Chloride 102  96 - 112 mEq/L   CO2 23  19 - 32 mEq/L   Glucose, Bld 91  70 - 99 mg/dL   BUN 20  6 - 23 mg/dL   Creatinine, Ser 0.34  0.50 - 1.35 mg/dL   Calcium 9.1  8.4 - 74.2 mg/dL   GFR calc non Af Amer 76 (*) >90 mL/min   GFR calc Af Amer 88 (*) >90 mL/min  D-DIMER, QUANTITATIVE      Result Value Ref Range   D-Dimer, Quant <0.27  0.00 - 0.48 ug/mL-FEU  POCT I-STAT TROPONIN I      Result Value Ref Range   Troponin i, poc 0.01  0.00 - 0.08 ng/mL   Comment 3              Imaging Review Dg Cervical Spine Complete  06/20/2013   CLINICAL DATA:  Right-sided neck pain without trauma.  EXAM: CERVICAL SPINE  4+ VIEWS  COMPARISON:  None.  FINDINGS: The lateral view images through the bottom of C6. Prevertebral soft tissues are within normal limits. Mild spondylosis at C3-4 and C5-6 with endplate osteophyte formation.  Left-sided neural foraminal narrowing at C3-4. Right-sided neural foraminal narrowing also  at C3-4 and possibly C5-6. Lateral masses and odontoid process suboptimally evaluated on open-mouth views.  Swimmer's view demonstrates grossly maintained C7 and T1 vertebral body height.  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  Suboptimal evaluation of C1-2 and C7-T1.   Electronically Signed   By: Jeronimo Greaves M.D.   On: 06/20/2013 18:44    Dg Chest 2 View (if Patient Has Fever And/or Copd)  06/11/2013   CLINICAL DATA:  Right chest pain and productive cough for 4 days.  IMPRESSION: No acute disease.   Electronically Signed   By: Drusilla Kanner M.D.   On: 06/11/2013 13:57    EKG Interpretation    Date/Time:  Tuesday June 20 2013 16:24:24 EST Ventricular Rate:  81 PR Interval:  192 QRS Duration: 69 QT Interval:  348 QTC Calculation: 404 R Axis:   80 Text Interpretation:  Sinus rhythm Anteroseptal infarct, old Left ventricular hypertrophy Since last tracing 11 Jun 2013 Nonspecific T wave abnormality Lateral leads Confirmed by Haleema Vanderheyden  MD-I, Mika Griffitts ([id number]) on 06/20/2013 4:38:02 PM  MDM  patient with continuous right-sided chest pain several days that is painful to touch and with movement of his arm in his head. Patient does have some foraminal narrowing on his cervical spine x-rays. He is going to be referred to neurosurgery. He also has pain on abduction of his arm and may have a rotator cuff problem also. 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    Final diagnoses:  Cervical radiculopathy  Trapezius muscle strain    New Prescriptions   CYCLOBENZAPRINE (FLEXERIL) 10 MG TABLET    Take 1 tablet (10 mg total) by mouth 3 (three) times daily as needed for muscle spasms.   NAPROXEN SODIUM (ANAPROX DS) 550 MG TABLET    Take 1 tablet (550 mg total) by mouth 2 (two) times daily with a meal.   PREDNISONE (DELTASONE) 20 MG TABLET    Take 3 po QD x 3d , then 2 po QD x 3d then 1 po QD x 3d      Plan discharge   Devoria Albe, MD, Franz Dell, MD 06/20/13 1929

## 2013-06-20 NOTE — ED Notes (Signed)
Pt came in last Sunday for chest pain, c/o today of right sided chest pain radiating down arm to shoulder blade

## 2013-06-20 NOTE — Discharge Instructions (Signed)
Try ice and heat to your sore muscles. Take the medication as prescribed. Call Dr Fredrich BirksStern's office to get an appointment to be rechecked for the pain in your right neck radiating into your right hand. You can have Dr Luiz BlareGraves check your shoulder for rotator cuff problem.    Cervical Radiculopathy Cervical radiculopathy happens when a nerve in the neck is pinched or bruised by a slipped (herniated) disk or by arthritic changes in the bones of the cervical spine. This can occur due to an injury or as part of the normal aging process. Pressure on the cervical nerves can cause pain or numbness that runs from your neck all the way down into your arm and fingers. CAUSES  There are many possible causes, including:  Injury.  Muscle tightness in the neck from overuse.  Swollen, painful joints (arthritis).  Breakdown or degeneration in the bones and joints of the spine (spondylosis) due to aging.  Bone spurs that may develop near the cervical nerves. SYMPTOMS  Symptoms include pain, weakness, or numbness in the affected arm and hand. Pain can be severe or irritating. Symptoms may be worse when extending or turning the neck. DIAGNOSIS  Your caregiver will ask about your symptoms and do a physical exam. He or she may test your strength and reflexes. X-rays, CT scans, and MRI scans may be needed in cases of injury or if the symptoms do not go away after a period of time. Electromyography (EMG) or nerve conduction testing may be done to study how your nerves and muscles are working. TREATMENT  Your caregiver may recommend certain exercises to help relieve your symptoms. Cervical radiculopathy can, and often does, get better with time and treatment. If your problems continue, treatment options may include:  Wearing a soft collar for short periods of time.  Physical therapy to strengthen the neck muscles.  Medicines, such as nonsteroidal anti-inflammatory drugs (NSAIDs), oral corticosteroids, or spinal  injections.  Surgery. Different types of surgery may be done depending on the cause of your problems. HOME CARE INSTRUCTIONS   Put ice on the affected area.  Put ice in a plastic bag.  Place a towel between your skin and the bag.  Leave the ice on for 15-20 minutes, 03-04 times a day or as directed by your caregiver.  If ice does not help, you can try using heat. Take a warm shower or bath, or use a hot water bottle as directed by your caregiver.  You may try a gentle neck and shoulder massage.  Use a flat pillow when you sleep.  Only take over-the-counter or prescription medicines for pain, discomfort, or fever as directed by your caregiver.  If physical therapy was prescribed, follow your caregiver's directions.  If a soft collar was prescribed, use it as directed. SEEK IMMEDIATE MEDICAL CARE IF:   Your pain gets much worse and cannot be controlled with medicines.  You have weakness or numbness in your hand, arm, face, or leg.  You have a high fever or a stiff, rigid neck.  You lose bowel or bladder control (incontinence).  You have trouble with walking, balance, or speaking. MAKE SURE YOU:   Understand these instructions.  Will watch your condition.  Will get help right away if you are not doing well or get worse. Document Released: 01/06/2001 Document Revised: 07/06/2011 Document Reviewed: 11/25/2010 [facility] Patient Information 2014 [place], [place].  Cryotherapy Cryotherapy means treatment with cold. Ice or gel packs can be used to reduce both pain and swelling.  Ice is the most helpful within the first 24 to 48 hours after an injury or flareup from overusing a muscle or joint. Sprains, strains, spasms, burning pain, shooting pain, and aches can all be eased with ice. Ice can also be used when recovering from surgery. Ice is effective, has very few side effects, and is safe for most people to use. PRECAUTIONS  Ice is not a safe treatment option for people  with:  Raynaud's phenomenon. This is a condition affecting small blood vessels in the extremities. Exposure to cold may cause your problems to return.  Cold hypersensitivity. There are many forms of cold hypersensitivity, including:  Cold urticaria. Red, itchy hives appear on the skin when the tissues begin to warm after being iced.  Cold erythema. This is a red, itchy rash caused by exposure to cold.  Cold hemoglobinuria. Red blood cells break down when the tissues begin to warm after being iced. The hemoglobin that carry oxygen are passed into the urine because they cannot combine with blood proteins fast enough.  Numbness or altered sensitivity in the area being iced. If you have any of the following conditions, do not use ice until you have discussed cryotherapy with your caregiver:  Heart conditions, such as arrhythmia, angina, or chronic heart disease.  High blood pressure.  Healing wounds or open skin in the area being iced.  Current infections.  Rheumatoid arthritis.  Poor circulation.  Diabetes. Ice slows the blood flow in the region it is applied. This is beneficial when trying to stop inflamed tissues from spreading irritating chemicals to surrounding tissues. However, if you expose your skin to cold temperatures for too long or without the proper protection, you can damage your skin or nerves. Watch for signs of skin damage due to cold. HOME CARE INSTRUCTIONS Follow these tips to use ice and cold packs safely.  Place a dry or damp towel between the ice and skin. A damp towel will cool the skin more quickly, so you may need to shorten the time that the ice is used.  For a more rapid response, add gentle compression to the ice.  Ice for no more than 10 to 20 minutes at a time. The bonier the area you are icing, the less time it will take to get the benefits of ice.  Check your skin after 5 minutes to make sure there are no signs of a poor response to cold or skin  damage.  Rest 20 minutes or more in between uses.  Once your skin is numb, you can end your treatment. You can test numbness by very lightly touching your skin. The touch should be so light that you do not see the skin dimple from the pressure of your fingertip. When using ice, most people will feel these normal sensations in this order: cold, burning, aching, and numbness.  Do not use ice on someone who cannot communicate their responses to pain, such as small children or people with dementia. HOW TO MAKE AN ICE PACK Ice packs are the most common way to use ice therapy. Other methods include ice massage, ice baths, and cryo-sprays. Muscle creams that cause a cold, tingly feeling do not offer the same benefits that ice offers and should not be used as a substitute unless recommended by your caregiver. To make an ice pack, do one of the following:  Place crushed ice or a bag of frozen vegetables in a sealable plastic bag. Squeeze out the excess air. Place this bag  inside another plastic bag. Slide the bag into a pillowcase or place a damp towel between your skin and the bag.  Mix 3 parts water with 1 part rubbing alcohol. Freeze the mixture in a sealable plastic bag. When you remove the mixture from the freezer, it will be slushy. Squeeze out the excess air. Place this bag inside another plastic bag. Slide the bag into a pillowcase or place a damp towel between your skin and the bag. SEEK MEDICAL CARE IF:  You develop white spots on your skin. This may give the skin a blotchy (mottled) appearance.  Your skin turns blue or pale.  Your skin becomes waxy or hard.  Your swelling gets worse. MAKE SURE YOU:   Understand these instructions.  Will watch your condition.  Will get help right away if you are not doing well or get worse. Document Released: 12/08/2010 Document Revised: 07/06/2011 Document Reviewed: 12/08/2010 [facility] Patient Information 2014 [place], [place].

## 2014-04-25 ENCOUNTER — Ambulatory Visit: Payer: 59

## 2014-04-25 ENCOUNTER — Emergency Department (INDEPENDENT_AMBULATORY_CARE_PROVIDER_SITE_OTHER)
Admission: EM | Admit: 2014-04-25 | Discharge: 2014-04-25 | Disposition: A | Discharge status: Home / Self Care | Payer: 59 | Source: Home / Self Care | Attending: Family Medicine | Admitting: Family Medicine

## 2014-04-25 ENCOUNTER — Encounter (HOSPITAL_COMMUNITY): Payer: Self-pay | Admitting: Emergency Medicine

## 2014-04-25 DIAGNOSIS — S39012A Strain of muscle, fascia and tendon of lower back, initial encounter: Secondary | ICD-10-CM

## 2014-04-25 DIAGNOSIS — R319 Hematuria, unspecified: Secondary | ICD-10-CM

## 2014-04-25 DIAGNOSIS — R030 Elevated blood-pressure reading, without diagnosis of hypertension: Secondary | ICD-10-CM

## 2014-04-25 DIAGNOSIS — IMO0001 Reserved for inherently not codable concepts without codable children: Secondary | ICD-10-CM

## 2014-04-25 LAB — POCT URINALYSIS DIP (DEVICE)
Bilirubin Urine: NEGATIVE
GLUCOSE, UA: NEGATIVE mg/dL
KETONES UR: NEGATIVE mg/dL
Leukocytes, UA: NEGATIVE
Nitrite: NEGATIVE
PH: 7 (ref 5.0–8.0)
Protein, ur: NEGATIVE mg/dL
Specific Gravity, Urine: 1.025 (ref 1.005–1.030)
Urobilinogen, UA: 0.2 mg/dL (ref 0.0–1.0)

## 2014-04-25 MED ORDER — NAPROXEN 375 MG PO TABS: 375.0000 mg | ORAL_TABLET | Freq: Two times a day (BID) | ORAL | Status: DC

## 2014-04-25 NOTE — Discharge Instructions (Signed)
Lower back muscle strain. You do not have a kidney infection Naprosyn as directed for pain Advised to follow up with your primary care doctor for elevated blood pressure, a small amount of blood in your urine and if back discomfort does not resolve. Needs repeat urine testing in one month.  Back Exercises Back exercises help treat and prevent back injuries. The goal of back exercises is to increase the strength of your abdominal and back muscles and the flexibility of your back. These exercises should be started when you no longer have back pain. Back exercises include:  Pelvic Tilt. Lie on your back with your knees bent. Tilt your pelvis until the lower part of your back is against the floor. Hold this position 5 to 10 sec and repeat 5 to 10 times.  Knee to Chest. Pull first 1 knee up against your chest and hold for 20 to 30 seconds, repeat this with the other knee, and then both knees. This may be done with the other leg straight or bent, whichever feels better.  Sit-Ups or Curl-Ups. Bend your knees 90 degrees. Start with tilting your pelvis, and do a partial, slow sit-up, lifting your trunk only 30 to 45 degrees off the floor. Take at least 2 to 3 seconds for each sit-up. Do not do sit-ups with your knees out straight. If partial sit-ups are difficult, simply do the above but with only tightening your abdominal muscles and holding it as directed.  Hip-Lift. Lie on your back with your knees flexed 90 degrees. Push down with your feet and shoulders as you raise your hips a couple inches off the floor; hold for 10 seconds, repeat 5 to 10 times.  Back arches. Lie on your stomach, propping yourself up on bent elbows. Slowly press on your hands, causing an arch in your low back. Repeat 3 to 5 times. Any initial stiffness and discomfort should lessen with repetition over time.  Shoulder-Lifts. Lie face down with arms beside your body. Keep hips and torso pressed to floor as you slowly lift your head and  shoulders off the floor. Do not overdo your exercises, especially in the beginning. Exercises may cause you some mild back discomfort which lasts for a few minutes; however, if the pain is more severe, or lasts for more than 15 minutes, do not continue exercises until you see your caregiver. Improvement with exercise therapy for back problems is slow.  See your caregivers for assistance with developing a proper back exercise program. Document Released: 05/21/2004 Document Revised: 07/06/2011 Document Reviewed: 02/12/2011 [facility] Patient Information 2015 [place], [place]. This information is not intended to replace advice given to you by your health care provider. Make sure you discuss any questions you have with your health care provider.  Back Pain, Adult Low back pain is very common. About 1 in 5 people have back pain.The cause of low back pain is rarely dangerous. The pain often gets better over time.About half of people with a sudden onset of back pain feel better in just 2 weeks. About 8 in 10 people feel better by 6 weeks.  CAUSES Some common causes of back pain include:  Strain of the muscles or ligaments supporting the spine.  Wear and tear (degeneration) of the spinal discs.  Arthritis.  Direct injury to the back. DIAGNOSIS Most of the time, the direct cause of low back pain is not known.However, back pain can be treated effectively even when the exact cause of the pain is unknown.Answering your caregiver's  questions about your overall health and symptoms is one of the most accurate ways to make sure the cause of your pain is not dangerous. If your caregiver needs more information, he or she may order lab work or imaging tests (X-rays or MRIs).However, even if imaging tests show changes in your back, this usually does not require surgery. HOME CARE INSTRUCTIONS For many people, back pain returns.Since low back pain is rarely dangerous, it is often a condition that people can learn  to [facility] their own.   Remain active. It is stressful on the back to sit or stand in one place. Do not sit, drive, or stand in one place for more than 30 minutes at a time. Take short walks on level surfaces as soon as pain allows.Try to increase the length of time you walk each day.  Do not stay in bed.Resting more than 1 or 2 days can delay your recovery.  Do not avoid exercise or work.Your body is made to move.It is not dangerous to be active, even though your back may hurt.Your back will likely heal faster if you return to being active before your pain is gone.  Pay attention to your body when you bend and lift. Many people have less discomfortwhen lifting if they bend their knees, keep the load close to their bodies,and avoid twisting. Often, the most comfortable positions are those that put less stress on your recovering back.  Find a comfortable position to sleep. Use a firm mattress and lie on your side with your knees slightly bent. If you lie on your back, put a pillow under your knees.  Only take over-the-counter or prescription medicines as directed by your caregiver. Over-the-counter medicines to reduce pain and inflammation are often the most helpful.Your caregiver may prescribe muscle relaxant drugs.These medicines help dull your pain so you can more quickly return to your normal activities and healthy exercise.  Put ice on the injured area.  Put ice in a plastic bag.  Place a towel between your skin and the bag.  Leave the ice on for 15-20 minutes, 03-04 times a day for the first 2 to 3 days. After that, ice and heat may be alternated to reduce pain and spasms.  Ask your caregiver about trying back exercises and gentle massage. This may be of some benefit.  Avoid feeling anxious or stressed.Stress increases muscle tension and can worsen back pain.It is important to recognize when you are anxious or stressed and learn ways to manage it.Exercise is a great  option. SEEK MEDICAL CARE IF:  You have pain that is not relieved with rest or medicine.  You have pain that does not improve in 1 week.  You have new symptoms.  You are generally not feeling well. SEEK IMMEDIATE MEDICAL CARE IF:   You have pain that radiates from your back into your legs.  You develop new bowel or bladder control problems.  You have unusual weakness or numbness in your arms or legs.  You develop nausea or vomiting.  You develop abdominal pain.  You feel faint. Document Released: 04/13/2005 Document Revised: 10/13/2011 Document Reviewed: 08/15/2013 [facility] Patient Information 2015 [place], [place]. This information is not intended to replace advice given to you by your health care provider. Make sure you discuss any questions you have with your health care provider.  Hematuria Hematuria is blood in your urine. It can be caused by a bladder infection, kidney infection, prostate infection, kidney stone, or cancer of your urinary tract. Infections  can usually be treated with medicine, and a kidney stone usually will pass through your urine. If neither of these is the cause of your hematuria, further workup to find out the reason may be needed. It is very important that you tell your health care provider about any blood you see in your urine, even if the blood stops without treatment or happens without causing pain. Blood in your urine that happens and then stops and then happens again can be a symptom of a very serious condition. Also, pain is not a symptom in the initial stages of many urinary cancers. HOME CARE INSTRUCTIONS   Drink lots of fluid, 3-4 quarts a day. If you have been diagnosed with an infection, cranberry juice is especially recommended, in addition to large amounts of water.  Avoid caffeine, tea, and carbonated beverages because they tend to irritate the bladder.  Avoid alcohol because it may irritate the prostate.  Take all medicines as directed by  your health care provider.  If you were prescribed an antibiotic medicine, finish it all even if you start to feel better.  If you have been diagnosed with a kidney stone, follow your health care provider's instructions regarding straining your urine to catch the stone.  Empty your bladder often. Avoid holding urine for long periods of time.  After a bowel movement, women should cleanse front to back. Use each tissue only once.  Empty your bladder before and after sexual intercourse if you are a male. SEEK MEDICAL CARE IF:  You develop back pain.  You have a fever.  You have a feeling of sickness in your stomach (nausea) or vomiting.  Your symptoms are not better in 3 days. Return sooner if you are getting worse. SEEK IMMEDIATE MEDICAL CARE IF:   You develop severe vomiting and are unable to keep the medicine down.  You develop severe back or abdominal pain despite taking your medicines.  You begin passing a large amount of blood or clots in your urine.  You feel extremely weak or faint, or you pass out. MAKE SURE YOU:   Understand these instructions.  Will watch your condition.  Will get help right away if you are not doing well or get worse. Document Released: 04/13/2005 Document Revised: 08/28/2013 Document Reviewed: 12/12/2012 [facility] Patient Information 2015 [place], [place]. This information is not intended to replace advice given to you by your health care provider. Make sure you discuss any questions you have with your health care provider.  Hypertension Hypertension, commonly called high blood pressure, is when the force of blood pumping through your arteries is too strong. Your arteries are the blood vessels that carry blood from your heart throughout your body. A blood pressure reading consists of a higher number over a lower number, such as 110/72. The higher number (systolic) is the pressure inside your arteries when your heart pumps. The lower number  (diastolic) is the pressure inside your arteries when your heart relaxes. Ideally you want your blood pressure below 120/80. Hypertension forces your heart to work harder to pump blood. Your arteries may become narrow or stiff. Having hypertension puts you at risk for heart disease, stroke, and other problems.  RISK FACTORS Some risk factors for high blood pressure are controllable. Others are not.  Risk factors you cannot control include:   Race. You may be at higher risk if you are African American.  Age. Risk increases with age.  Gender. Men are at higher risk than women before age 6  years. After age 53, women are at higher risk than men. Risk factors you can control include:  Not getting enough exercise or physical activity.  Being overweight.  Getting too much fat, sugar, calories, or salt in your diet.  Drinking too much alcohol. SIGNS AND SYMPTOMS Hypertension does not usually cause signs or symptoms. Extremely high blood pressure (hypertensive crisis) may cause headache, anxiety, shortness of breath, and nosebleed. DIAGNOSIS  To check if you have hypertension, your health care provider will measure your blood pressure while you are seated, with your arm held at the level of your heart. It should be measured at least twice using the same arm. Certain conditions can cause a difference in blood pressure between your right and left arms. A blood pressure reading that is higher than normal on one occasion does not mean that you need treatment. If one blood pressure reading is high, ask your health care provider about having it checked again. TREATMENT  Treating high blood pressure includes making lifestyle changes and possibly taking medicine. Living a healthy lifestyle can help lower high blood pressure. You may need to change some of your habits. Lifestyle changes may include:  Following the DASH diet. This diet is high in fruits, vegetables, and whole grains. It is low in salt, red  meat, and added sugars.  Getting at least 2 hours of brisk physical activity every week.  Losing weight if necessary.  Not smoking.  Limiting alcoholic beverages.  Learning ways to reduce stress. If lifestyle changes are not enough to get your blood pressure under control, your health care provider may prescribe medicine. You may need to take more than one. Work closely with your health care provider to understand the risks and benefits. HOME CARE INSTRUCTIONS  Have your blood pressure rechecked as directed by your health care provider.   Take medicines only as directed by your health care provider. Follow the directions carefully. Blood pressure medicines must be taken as prescribed. The medicine does not work as well when you skip doses. Skipping doses also puts you at risk for problems.   Do not smoke.   Monitor your blood pressure at home as directed by your health care provider. SEEK MEDICAL CARE IF:   You think you are having a reaction to medicines taken.  You have recurrent headaches or feel dizzy.  You have swelling in your ankles.  You have trouble with your vision. SEEK IMMEDIATE MEDICAL CARE IF:  You develop a severe headache or confusion.  You have unusual weakness, numbness, or feel faint.  You have severe chest or abdominal pain.  You vomit repeatedly.  You have trouble breathing. MAKE SURE YOU:   Understand these instructions.  Will watch your condition.  Will get help right away if you are not doing well or get worse. Document Released: 04/13/2005 Document Revised: 08/28/2013 Document Reviewed: 02/03/2013 [facility] Patient Information 2015 [place], [place]. This information is not intended to replace advice given to you by your health care provider. Make sure you discuss any questions you have with your health care provider.

## 2014-04-25 NOTE — ED Notes (Signed)
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

## 2014-04-25 NOTE — ED Provider Notes (Signed)
CSN: [id number]     Arrival date & time 04/25/14  1725 History   First MD Initiated Contact with Patient 04/25/14 1737     Chief Complaint  Patient presents with  . Back Pain   (Consider location/radiation/quality/duration/timing/severity/associated sxs/prior Treatment) HPI Comments: States he is most uncomfortable when he has to bend over to put on his pants or when he tries to raise his foot up to tie his shoes.  [place] [employer] [place] collecting [place].  PCP: Francis Coleman  Patient is a 53 y.o. male presenting with back pain. The history is provided by the patient.  Back Pain Location:  Lumbar spine Quality: +"stinging" Radiates to:  Does not radiate Pain severity:  Mild Onset quality:  Gradual Duration:  6 days Timing:  Constant Progression:  Unchanged Chronicity:  New Worsened by:  Bending Ineffective treatments:  None tried Associated symptoms: no abdominal pain, no bladder incontinence, no bowel incontinence, no dysuria, no fever, no headaches, no leg pain, no numbness, no paresthesias, no perianal numbness, no tingling, no weakness and no weight loss   Associated symptoms comment:  +occasional urinary frequency.    History reviewed. No pertinent past medical history. History reviewed. No pertinent past surgical history. No family history on file. History  Substance Use Topics  . Smoking status: Former Games developermoker  . Smokeless tobacco: Not on file  . Alcohol Use: Yes     Comment: socially    Review of Systems  Constitutional: Negative for fever and weight loss.  Gastrointestinal: Negative for abdominal pain and bowel incontinence.  Genitourinary: Negative for bladder incontinence and dysuria.  Musculoskeletal: Positive for back pain.  Neurological: Negative for tingling, weakness, numbness, headaches and paresthesias.  All other systems reviewed and are negative.   Allergies  Review of patient's allergies indicates no known allergies.  Home  Medications   Prior to Admission medications   Medication Sig Start Date End Date Taking? Authorizing Provider  cyclobenzaprine (FLEXERIL) 10 MG tablet Take 1 tablet (10 mg total) by mouth 3 (three) times daily as needed for muscle spasms. 06/20/13   Francis GivensIva L Knapp, MD  ibuprofen (ADVIL,MOTRIN) 600 MG tablet Take 1 tablet (600 mg total) by mouth every 6 (six) hours as needed. 06/11/13   Francis RudeNathan R. Rubin PayorPickering, MD  naproxen sodium (ANAPROX DS) 550 MG tablet Take 1 tablet (550 mg total) by mouth 2 (two) times daily with a meal. 06/20/13   Francis GivensIva L Knapp, MD  oxyCODONE-acetaminophen (PERCOCET/ROXICET) 5-325 MG per tablet Take 1-2 tablets by mouth every 6 (six) hours as needed for severe pain. 06/11/13   Francis RudeNathan R. Rubin PayorPickering, MD  predniSONE (DELTASONE) 20 MG tablet Take 3 po QD x 3d , then 2 po QD x 3d then 1 po QD x 3d 06/20/13   Francis GivensIva L Knapp, MD   BP 154/101 mmHg  Pulse 69  Temp(Src) 97.9 F (36.6 C) (Oral)  Resp 18  SpO2 99% Physical Exam  Constitutional: He is oriented to person, place, and time. He appears well-developed and well-nourished. No distress.  HENT:  Head: Normocephalic and atraumatic.  Eyes: Conjunctivae are normal. No scleral icterus.  Cardiovascular: Normal rate, regular rhythm and normal heart sounds.   Pulmonary/Chest: Effort normal and breath sounds normal. No respiratory distress. He has no wheezes.  Abdominal: Soft. Bowel sounds are normal. He exhibits no distension. There is no tenderness.  Musculoskeletal: Normal range of motion.       Lumbar back: He exhibits normal range of motion, no bony tenderness,  no swelling and no deformity.       Back:  Outlined areas are areas of discomfort with bending forward at waist or with straight leg raise.  CSM exam of bilateral lower extremities normal.   Neurological: He is alert and oriented to person, place, and time.  Skin: Skin is warm and dry. No rash noted. No erythema.  Psychiatric: He has a normal mood and affect. His behavior is  normal.  Nursing note and vitals reviewed.   ED Course  Procedures (including critical care time) Labs Review Labs Reviewed  POCT URINALYSIS DIP (DEVICE) - Abnormal; Notable for the following:    Hgb urine dipstick TRACE (*)    All other components within normal limits    Imaging Review No results found.   MDM   1. Elevated blood pressure   2. Hematuria   3. Strain of lumbar paraspinal muscle, initial encounter    Lumbar paraspinal muscle strain. Naprosyn as directed. Advised to follow up with PCP for elevated BP, trace hematuria and if back discomfort does not resolve. Needs repeat UA in one month.  UA only with trace heme    Francis Coleman, [place] 04/25/14 [phone]

## 2014-05-28 IMAGING — CR DG WRIST COMPLETE 3+V*R*
4 series · 4 of 4 positions shown · non-contrast
Comparison: Right hand examination.

CLINICAL DATA: History of injury on [DATE].  Pain in the anterior
medial aspect of the right wrist and palm of the hand..

RIGHT WRIST - COMPLETE 3+ VIEW

[x wrist pa right]
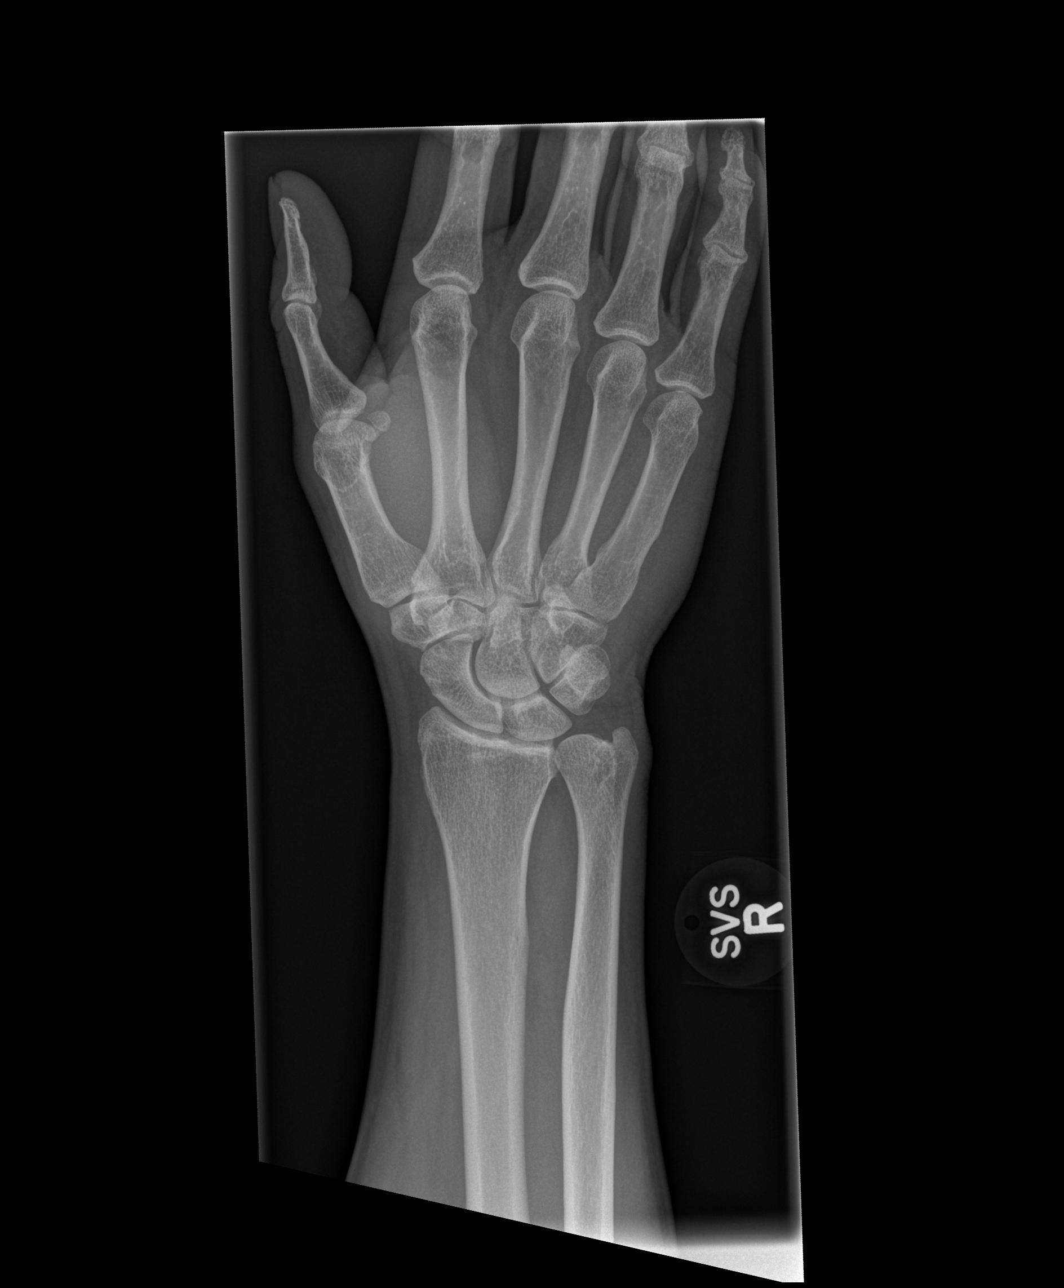

[x wrist obl right]
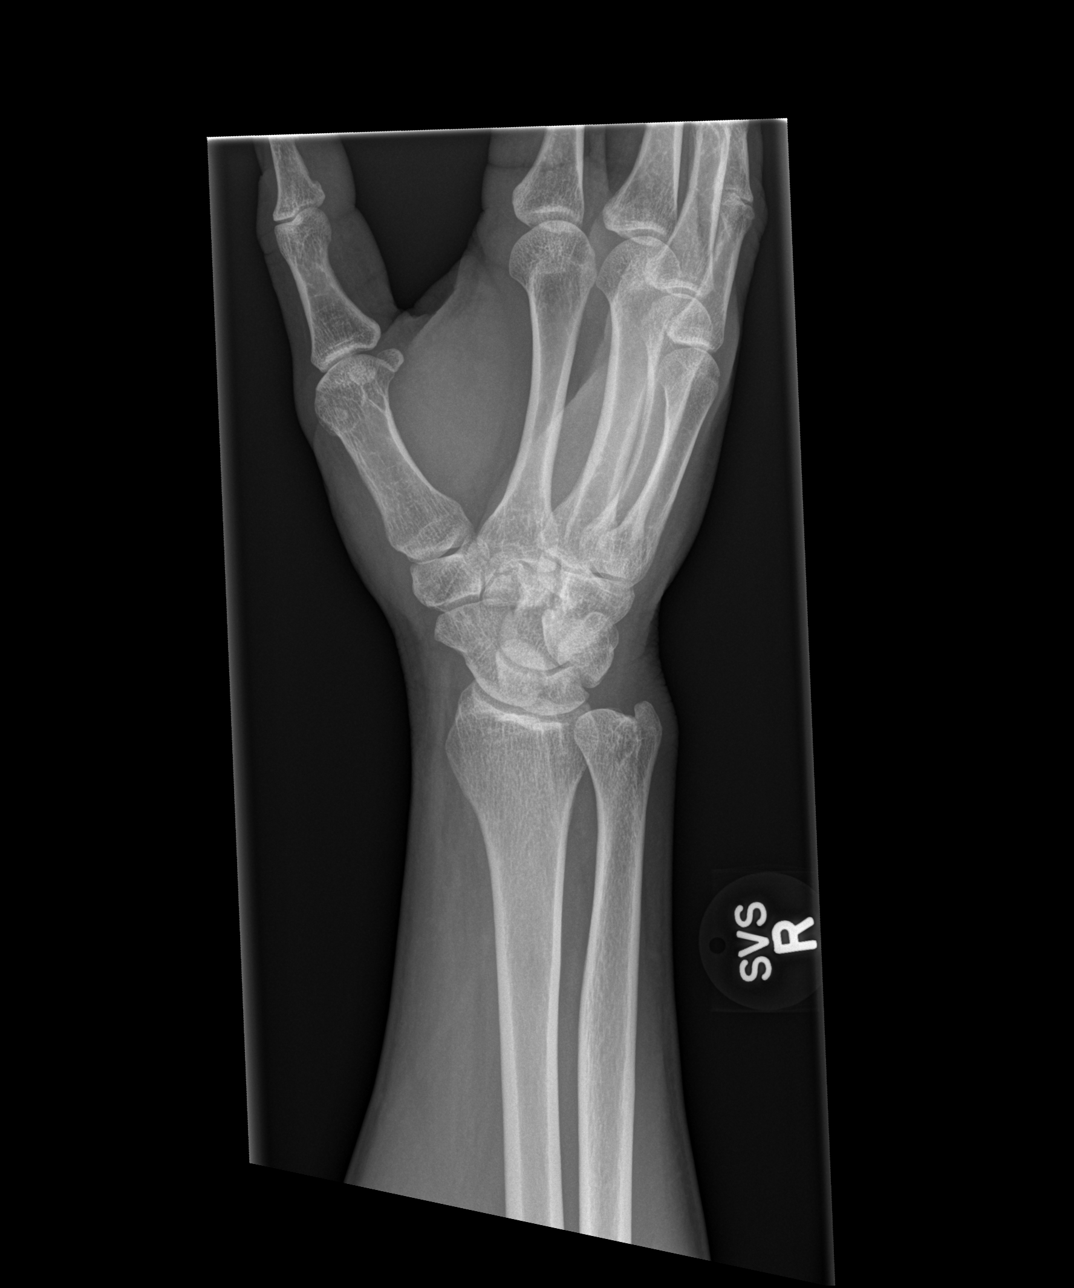

[x wrist lat right]
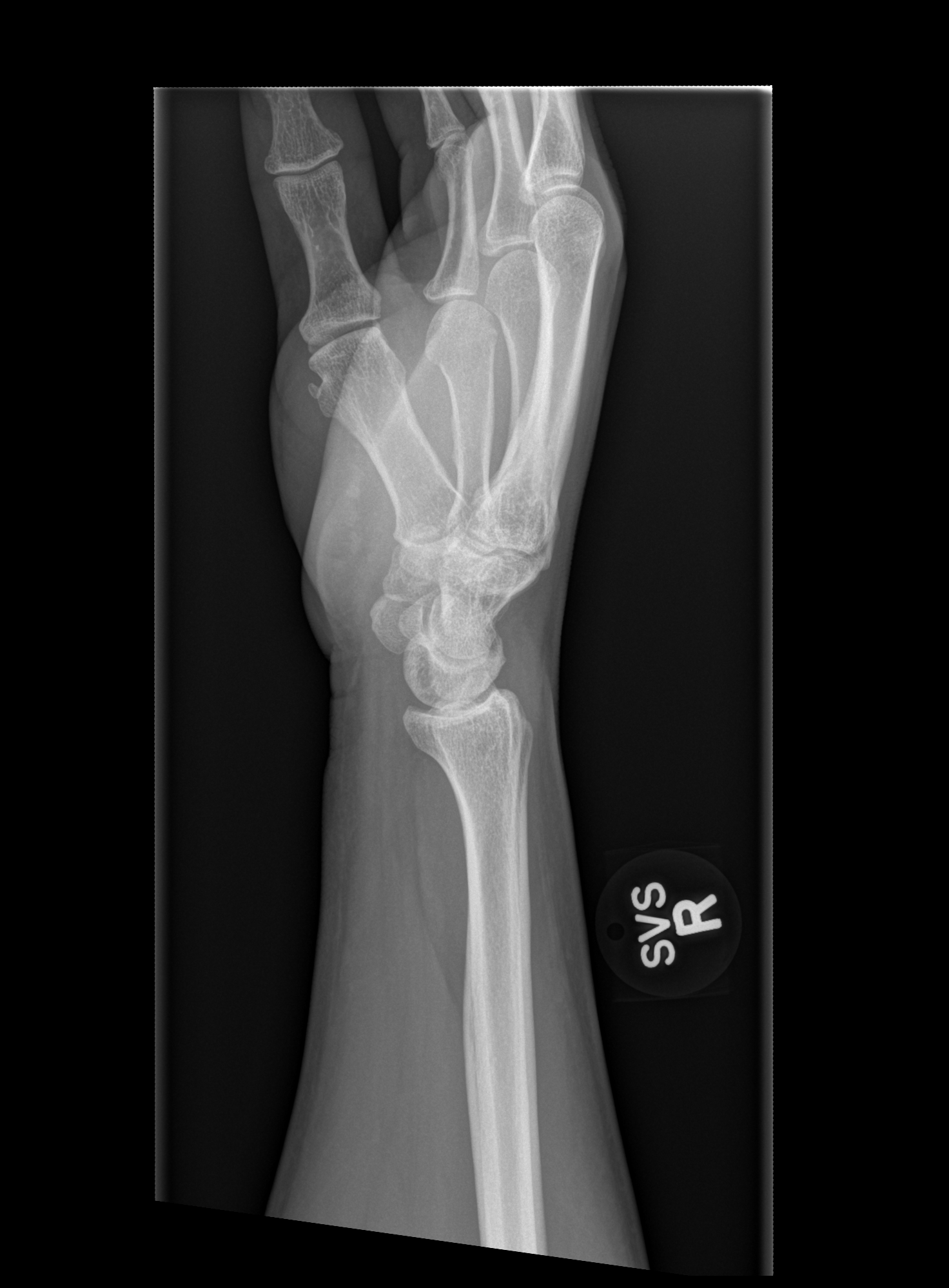

[x wrist navicular view right]
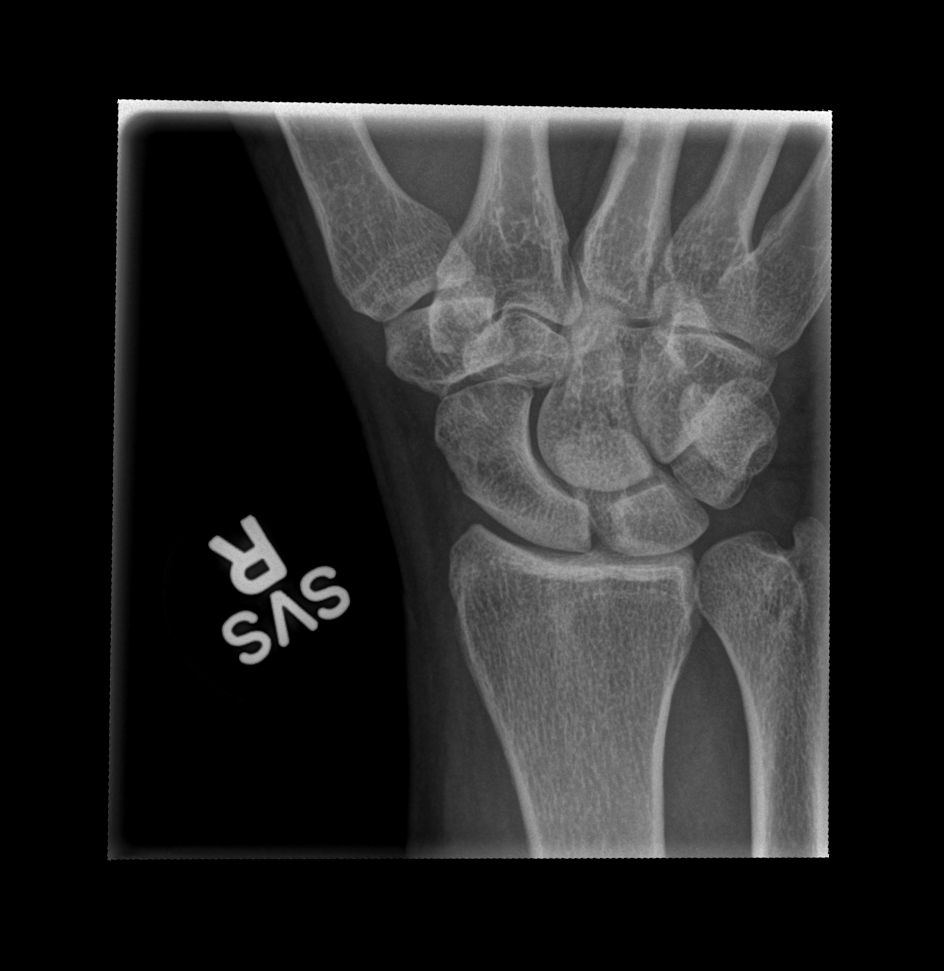

[4 of 4 positions shown; findings below may reference images not displayed]

FINDINGS: Alignment is normal.  Joint spaces are preserved.  No
fracture or dislocation is evident.  No soft tissue lesions are
seen.  Benign appearing cystic change is seen involving the distal
aspect of the scaphoid.  No chondrocalcinosis is seen.
IMPRESSION: No fracture or dislocation.  Benign appearing cystic change
involving distal aspect of scaphoid.

## 2014-06-20 ENCOUNTER — Other Ambulatory Visit: Payer: Self-pay | Admitting: Gastroenterology

## 2014-08-24 ENCOUNTER — Encounter (HOSPITAL_COMMUNITY): Payer: Self-pay | Admitting: *Deleted

## 2014-09-03 ENCOUNTER — Encounter (HOSPITAL_COMMUNITY): Payer: Self-pay | Admitting: Certified Registered Nurse Anesthetist

## 2014-09-03 ENCOUNTER — Ambulatory Visit (HOSPITAL_COMMUNITY): Payer: 59 | Admitting: Certified Registered Nurse Anesthetist

## 2014-09-03 ENCOUNTER — Encounter (HOSPITAL_COMMUNITY)
Admission: RE | Disposition: A | Discharge status: Home / Self Care | Payer: Self-pay | Source: Ambulatory Visit | Attending: Gastroenterology

## 2014-09-03 ENCOUNTER — Ambulatory Visit (HOSPITAL_COMMUNITY)
Admission: RE | Admit: 2014-09-03 | Discharge: 2014-09-03 | Disposition: A | Discharge status: Home / Self Care | Payer: 59 | Source: Ambulatory Visit | Attending: Gastroenterology | Admitting: Gastroenterology

## 2014-09-03 DIAGNOSIS — Z86010 Personal history of colonic polyps: Secondary | ICD-10-CM | POA: Diagnosis not present

## 2014-09-03 DIAGNOSIS — Z1211 Encounter for screening for malignant neoplasm of colon: Secondary | ICD-10-CM | POA: Diagnosis not present

## 2014-09-03 DIAGNOSIS — K219 Gastro-esophageal reflux disease without esophagitis: Secondary | ICD-10-CM | POA: Diagnosis not present

## 2014-09-03 DIAGNOSIS — Z87891 Personal history of nicotine dependence: Secondary | ICD-10-CM | POA: Insufficient documentation

## 2014-09-03 HISTORY — DX: Sleep apnea, unspecified: G47.30

## 2014-09-03 HISTORY — DX: Gastro-esophageal reflux disease without esophagitis: K21.9

## 2014-09-03 HISTORY — DX: Personal history of systemic steroid therapy: Z92.241

## 2014-09-03 HISTORY — PX: COLONOSCOPY WITH PROPOFOL: SHX5780

## 2014-09-03 SURGERY — COLONOSCOPY WITH PROPOFOL
Anesthesia: Monitor Anesthesia Care

## 2014-09-03 MED ORDER — PROPOFOL 10 MG/ML IV BOLUS
INTRAVENOUS | Status: AC
  Filled 2014-09-03: qty 20

## 2014-09-03 MED ORDER — PROPOFOL 10 MG/ML IV BOLUS
INTRAVENOUS | Status: DC | PRN
  Administered 2014-09-03: 10 mg via INTRAVENOUS
  Administered 2014-09-03 (×5): 20 mg via INTRAVENOUS

## 2014-09-03 MED ORDER — SODIUM CHLORIDE 0.9 % IV SOLN: INTRAVENOUS | Status: DC

## 2014-09-03 MED ORDER — LACTATED RINGERS IV SOLN
INTRAVENOUS | Status: DC
  Administered 2014-09-03: 125 mL via INTRAVENOUS

## 2014-09-03 MED ORDER — PROPOFOL 10 MG/ML IV BOLUS
INTRAVENOUS | Status: AC
Start: 2014-09-03 — End: 2014-09-03
  Filled 2014-09-03: qty 20

## 2014-09-03 SURGICAL SUPPLY — 21 items

## 2014-09-03 NOTE — Anesthesia Preprocedure Evaluation (Signed)
Anesthesia Evaluation  Patient identified by MRN, date of birth, ID band Patient awake    Reviewed: Allergy & Precautions, NPO status , Patient's Chart, lab work & pertinent test results  Airway Mallampati: II  TM Distance: >3 FB Neck ROM: Full    Dental no notable dental hx.    Pulmonary sleep apnea , former smoker,  breath sounds clear to auscultation  Pulmonary exam normal       Cardiovascular negative cardio ROS Normal cardiovascular examRhythm:Regular Rate:Normal     Neuro/Psych negative neurological ROS  negative psych ROS   GI/Hepatic Neg liver ROS, GERD-  ,  Endo/Other  negative endocrine ROS  Renal/GU negative Renal ROS  negative genitourinary   Musculoskeletal negative musculoskeletal ROS (+)   Abdominal   Peds negative pediatric ROS (+)  Hematology negative hematology ROS (+)   Anesthesia Other Findings   Reproductive/Obstetrics negative OB ROS                             Anesthesia Physical Anesthesia Plan  ASA: II  Anesthesia Plan: MAC   Post-op Pain Management:    Induction: Intravenous  Airway Management Planned: Natural Airway  Additional Equipment:   Intra-op Plan:   Post-operative Plan:   Informed Consent: I have reviewed the patients History and Physical, chart, labs and discussed the procedure including the risks, benefits and alternatives for the proposed anesthesia with the patient or authorized representative who has indicated his/her understanding and acceptance.   Dental advisory given  Plan Discussed with: CRNA  Anesthesia Plan Comments:         Anesthesia Quick Evaluation

## 2014-09-03 NOTE — Op Note (Signed)
Procedure: Surveillance colonoscopy. 10/19/2008 colonoscopy with removal of a colon polyp while residing in [place]  Endoscopist: Danise EdgeMartin Johnson  Premedication: Propofol administered by anesthesia  Procedure: The patient was placed in the left lateral decubitus position. Anal inspection and digital rectal exam were normal. The Pentax pediatric colonoscope was introduced into the rectum and advanced to the cecum. A normal-appearing appendiceal orifice was identified. A normal-appearing ileocecal valve was identified. Colonic preparation for the exam today was excellent. Withdrawal time was 6 minutes.  Rectum. Normal. Retroflexed view of the distal rectum normal  Avoid colon and descending colon. Normal  Splenic flexure. Normal  Transverse colon. Normal  Hepatic flexure. Normal  Ascending colon. Normal  Cecum and ileocecal valve.  Assessment: Normal colonoscopy  Recommendation: Schedule repeat screening colonoscopy in 10 years

## 2014-09-03 NOTE — H&P (Signed)
  Procedure: Surveillance colonoscopy. 10/19/2008 colonoscopy performed with removal of a colon polyp in [place]. I do not have the colon polyp pathology report.  History: The patient is a 54 year old male born [birth year]. In June 2010 he underwent a colonoscopy with removal of a colon polyp while residing in [place]. I do not have the colon polyp pathology report. He is scheduled to undergo a surveillance colonoscopy today.  Medication allergies: None  Past medical history: Circumcision. Arthroscopic right knee surgery.  Exam: The patient is alert and lying comfortably on the endoscopy stretcher. Abdomen is soft and nontender to palpation. Lungs are clear to auscultation. Cardiac exam reveals a regular rhythm.  Plan: Proceed with surveillance colonoscopy.

## 2014-09-03 NOTE — Anesthesia Postprocedure Evaluation (Signed)
  Anesthesia Post-op Note  Patient: Redmond SchoolWilliam J Ardizzone  Procedure(s) Performed: Procedure(s) (LRB): COLONOSCOPY WITH PROPOFOL (N/A)  Patient Location: PACU  Anesthesia Type: MAC  Level of Consciousness: awake and alert   Airway and Oxygen Therapy: Patient Spontanous Breathing  Post-op Pain: mild  Post-op Assessment: Post-op Vital signs reviewed, Patient's Cardiovascular Status Stable, Respiratory Function Stable, Patent Airway and No signs of Nausea or vomiting  Last Vitals:  Filed Vitals:   09/03/14 0947  BP:   Pulse: 73  Temp:   Resp: 22    Post-op Vital Signs: stable   Complications: No apparent anesthesia complications

## 2014-09-03 NOTE — Discharge Instructions (Signed)

## 2014-09-03 NOTE — Transfer of Care (Signed)
Immediate Anesthesia Transfer of Care Note  Patient: Francis Coleman  Procedure(s) Performed: Procedure(s): COLONOSCOPY WITH PROPOFOL (N/A)  Patient Location: PACU and Endoscopy Unit  Anesthesia Type:MAC  Level of Consciousness: awake, oriented, patient cooperative, lethargic and responds to stimulation  Airway & Oxygen Therapy: Patient Spontanous Breathing and Patient connected to face mask oxygen  Post-op Assessment: Report given to RN, Post -op Vital signs reviewed and stable and Patient moving all extremities  Post vital signs: Reviewed and stable  Last Vitals:  Filed Vitals:   09/03/14 0947  BP:   Pulse: 73  Temp:   Resp: 22    Complications: No apparent anesthesia complications

## 2014-09-04 ENCOUNTER — Encounter (HOSPITAL_COMMUNITY): Payer: Self-pay | Admitting: Gastroenterology

## 2015-01-03 IMAGING — CR DG CHEST 2V
2 series · 2 of 2 positions shown · non-contrast
Comparison: PA and lateral chest 04/16/2009 and 04/15/2009.

CLINICAL DATA: Right chest pain and productive cough for 4 days.

EXAM:
CHEST  2 VIEW

[w chest pa]
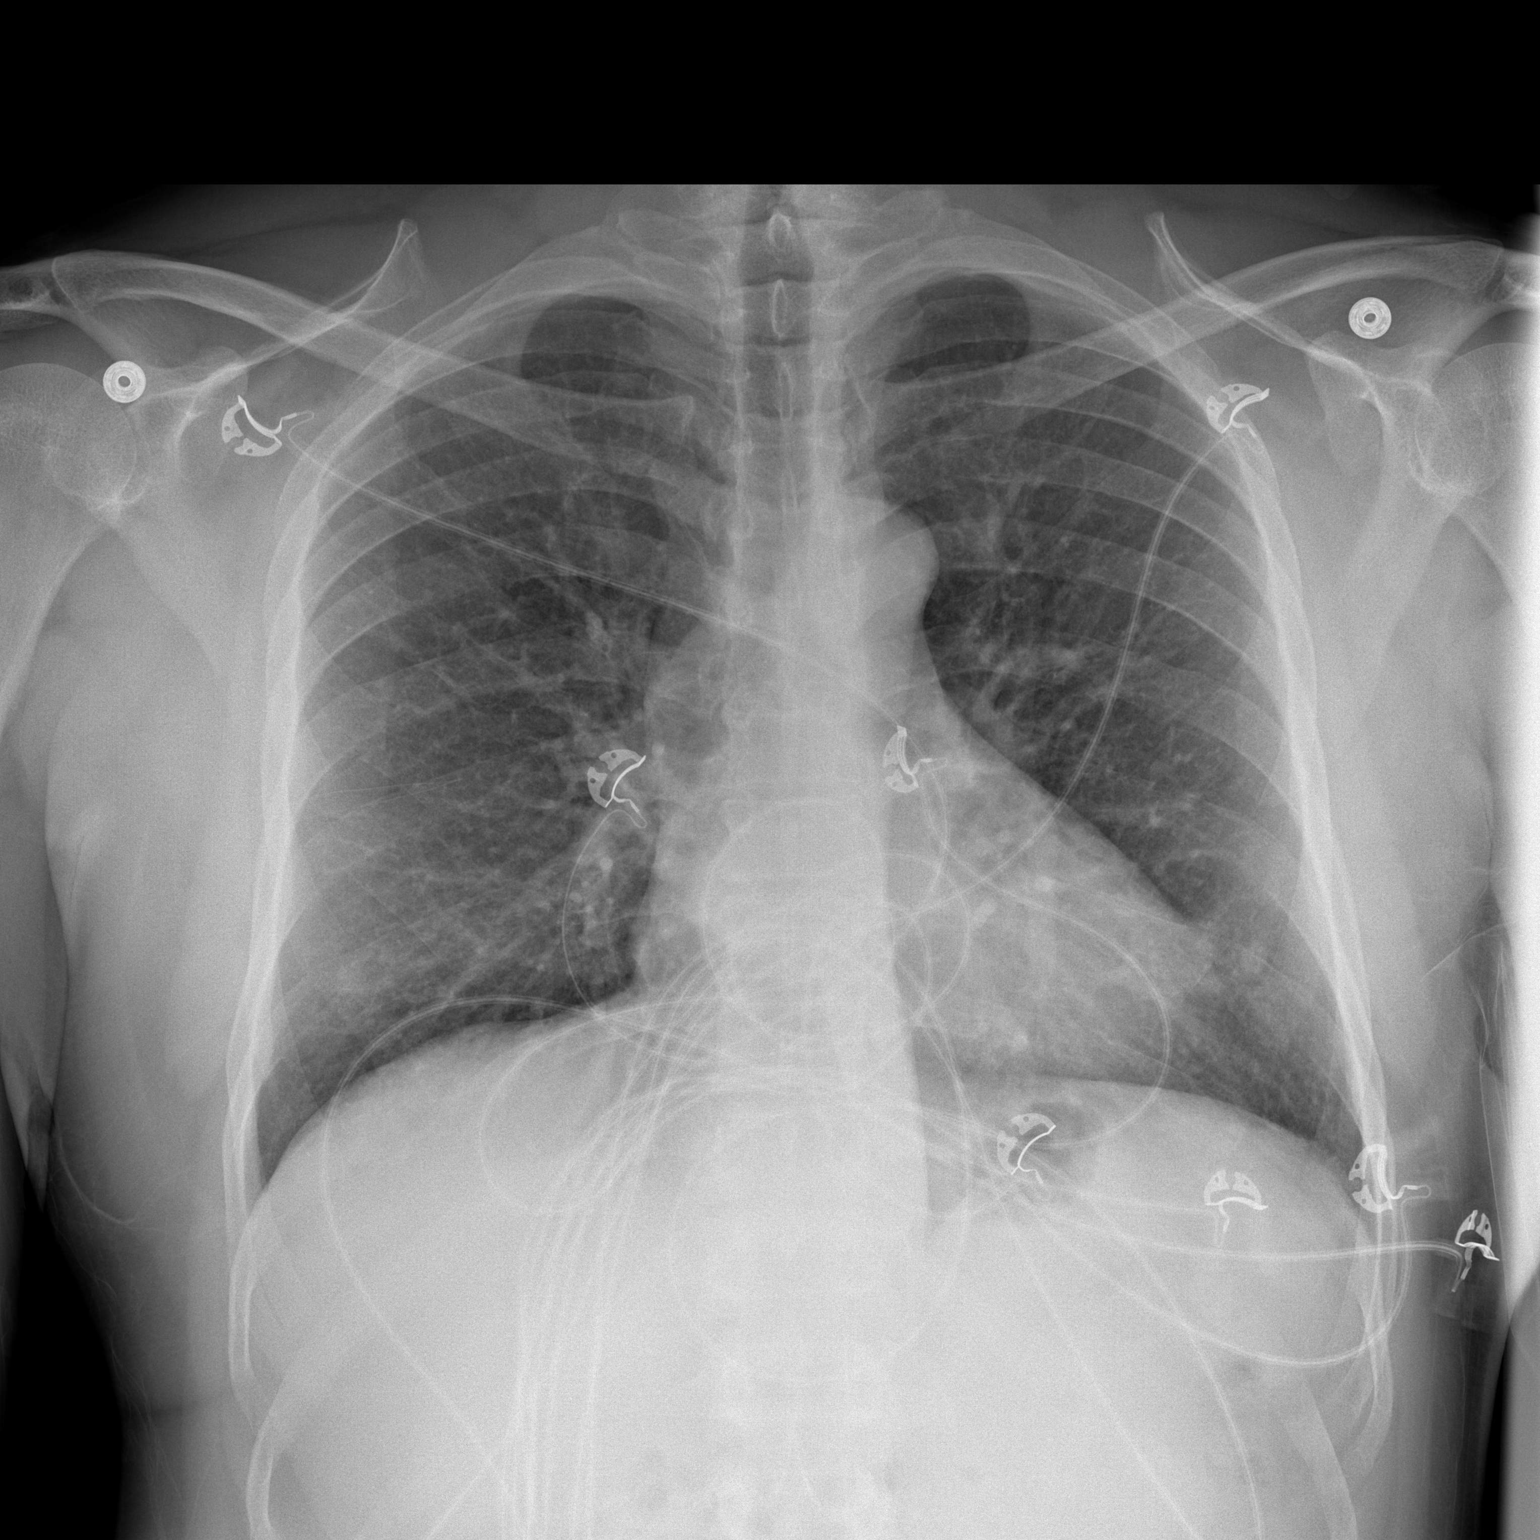

[w chest lat]
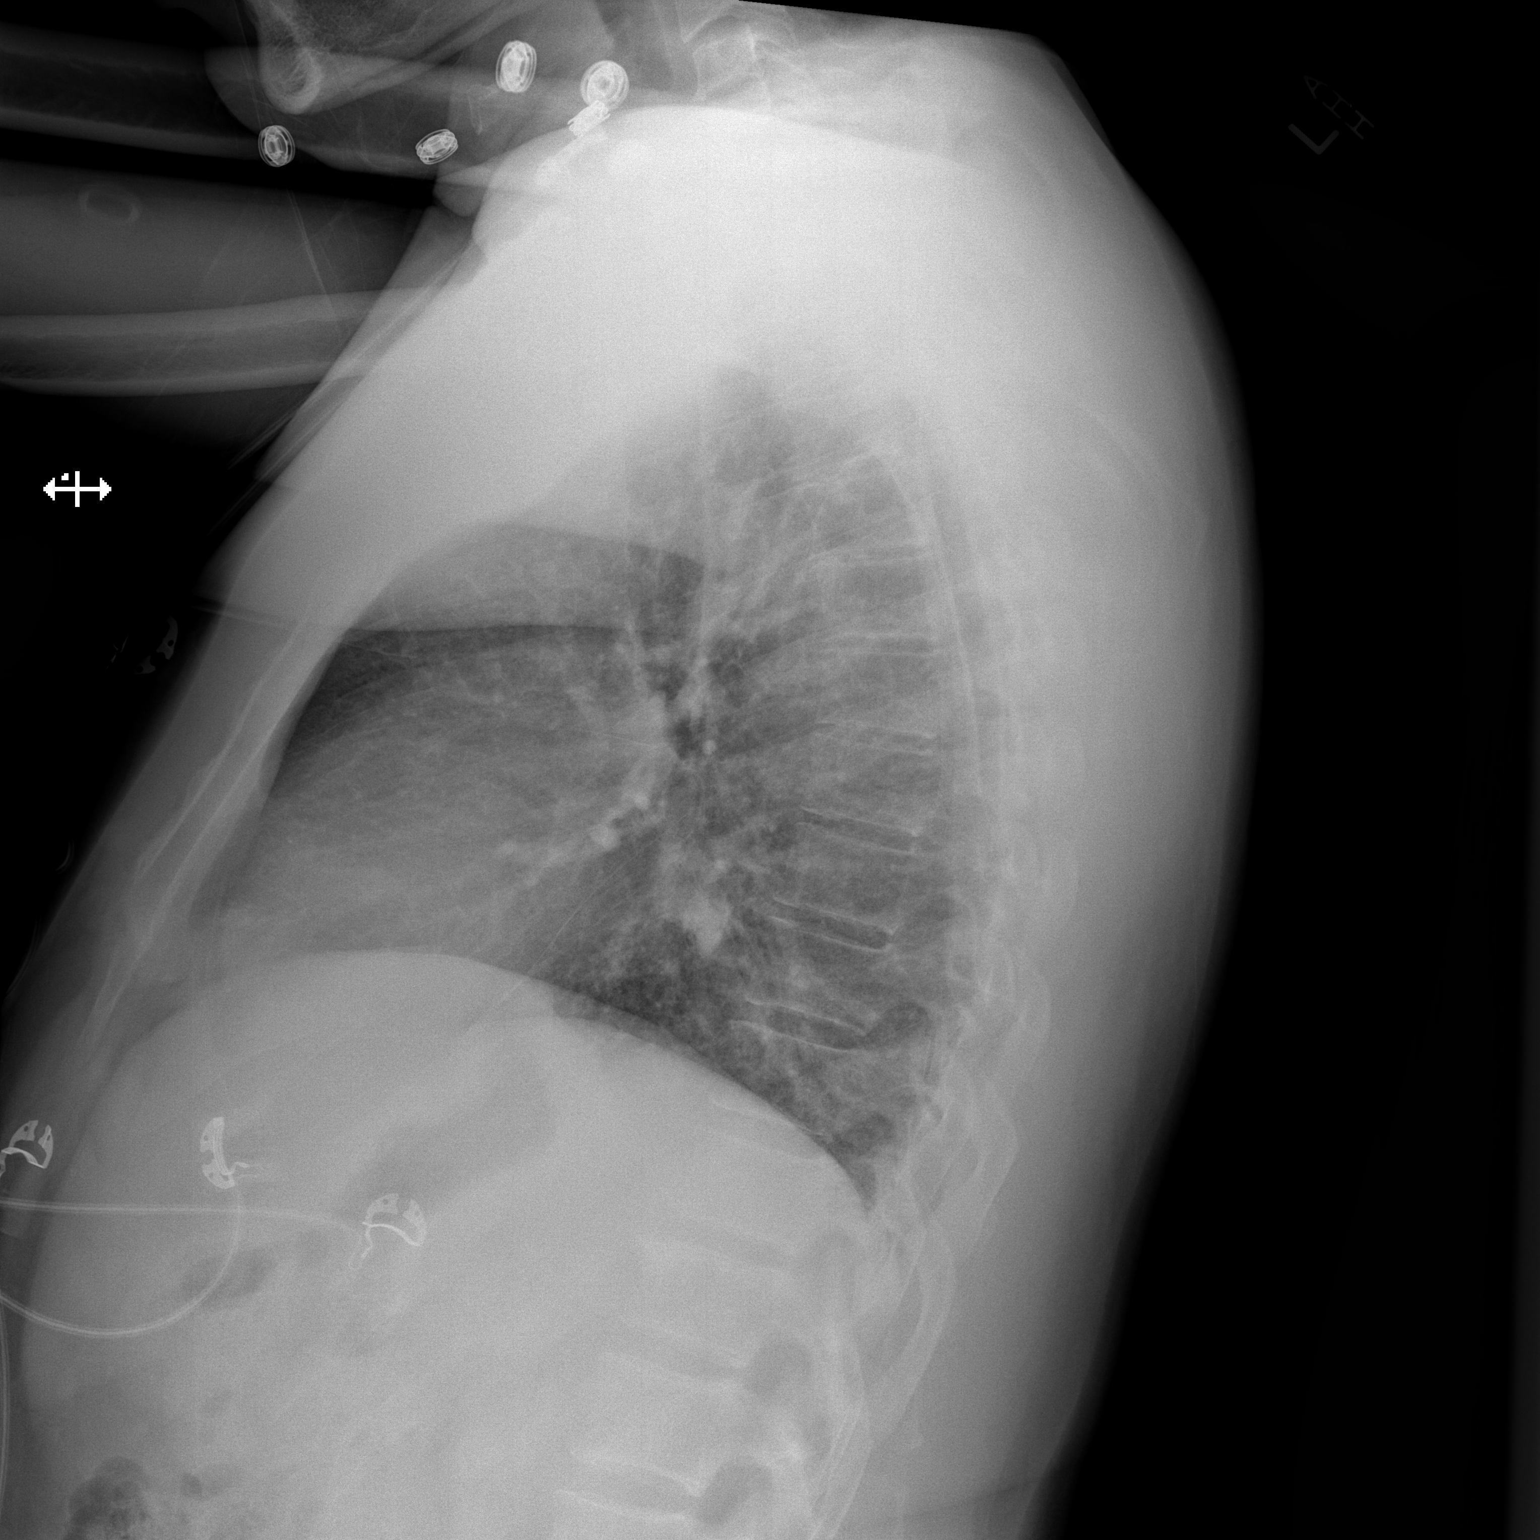

[2 of 2 positions shown; findings below may reference images not displayed]

FINDINGS: Nipple shadows are noted. Lungs are clear. Heart size is normal. No
pneumothorax or pleural effusion.
IMPRESSION: No acute disease.

## 2016-05-14 ENCOUNTER — Ambulatory Visit (HOSPITAL_COMMUNITY)
Admission: EM | Admit: 2016-05-14 | Discharge: 2016-05-14 | Disposition: A | Discharge status: Home / Self Care | Payer: BLUE CROSS/BLUE SHIELD | Attending: Emergency Medicine | Admitting: Emergency Medicine

## 2016-05-14 ENCOUNTER — Encounter (HOSPITAL_COMMUNITY): Payer: Self-pay | Admitting: Emergency Medicine

## 2016-05-14 ENCOUNTER — Ambulatory Visit: Payer: 59

## 2016-05-14 DIAGNOSIS — J00 Acute nasopharyngitis [common cold]: Secondary | ICD-10-CM | POA: Diagnosis not present

## 2016-05-14 DIAGNOSIS — K1120 Sialoadenitis, unspecified: Secondary | ICD-10-CM

## 2016-05-14 MED ORDER — NAPROXEN 500 MG PO TABS: 500.0000 mg | ORAL_TABLET | Freq: Two times a day (BID) | ORAL | 0 refills | Status: DC

## 2016-05-14 MED ORDER — SULFAMETHOXAZOLE-TRIMETHOPRIM 800-160 MG PO TABS: 1.0000 | ORAL_TABLET | Freq: Two times a day (BID) | ORAL | 0 refills | Status: AC

## 2016-05-14 NOTE — ED Triage Notes (Signed)
Pt c/o cold sx onset: 2 days  Sx include: nasal drainage/congestion, left eye burning, prod cough, BA, submandibular swelling on right side.   Denies: fevers  Has not had any meds today  A&O x4... NAD

## 2016-05-14 NOTE — ED Provider Notes (Signed)
CSN: [id number]     Arrival date & time 05/14/16  1306 History   First MD Initiated Contact with Patient 05/14/16 1448     Chief Complaint  Patient presents with  . URI   (Consider location/radiation/quality/duration/timing/severity/associated sxs/prior Treatment) Patient has developed right jaw and neck discomfort for last 2 days.  He has had some URI sx's for last week.   The history is provided by the patient.  URI  Presenting symptoms: congestion   Severity:  Moderate Onset quality:  Sudden Duration:  2 days Timing:  Constant Progression:  Worsening Chronicity:  New Relieved by:  None tried Worsened by:  Nothing Ineffective treatments:  None tried Associated symptoms: neck pain and swollen glands     Past Medical History:  Diagnosis Date  . GERD (gastroesophageal reflux disease)    past hx. -none recent-controls with diet.  . S/P epidural steroid injection    due to fall and" herniated disc"  . Sleep apnea    no use since 2010   Past Surgical History:  Procedure Laterality Date  . CIRCUMCISION    . COLONOSCOPY WITH PROPOFOL N/A 09/03/2014   Procedure: COLONOSCOPY WITH PROPOFOL;  Surgeon: Charolett BumpersMartin K Johnson, MD;  Location: [facility];  Service: Endoscopy;  Laterality: N/A;   History reviewed. No pertinent family history. Social History  Substance Use Topics  . Smoking status: Former Smoker    Packs/day: 0.25    Years: 2.00  . Smokeless tobacco: Former NeurosurgeonUser    Quit date: 08/23/1997  . Alcohol use Yes     Comment: socially    Review of Systems  Constitutional: Negative.   HENT: Positive for congestion.   Eyes: Negative.   Respiratory: Negative.   Cardiovascular: Negative.   Gastrointestinal: Negative.   Endocrine: Negative.   Genitourinary: Negative.   Musculoskeletal: Positive for neck pain.  Allergic/Immunologic: Negative.   Neurological: Negative.   Hematological: Negative.   Psychiatric/Behavioral: Negative.     Allergies  Patient has no known  allergies.  Home Medications   Prior to Admission medications   Medication Sig Start Date End Date Taking? Authorizing Provider  cyclobenzaprine (FLEXERIL) 10 MG tablet Take 5-10 mg by mouth 3 (three) times daily as needed for muscle spasms.    Historical Provider, MD  naproxen (NAPROSYN) 375 MG tablet Take 1 tablet (375 mg total) by mouth 2 (two) times daily with a meal. As needed for back pain 04/25/14   Ria ClockJennifer Lee H Presson, PA  naproxen (NAPROSYN) 500 MG tablet Take 1 tablet (500 mg total) by mouth 2 (two) times daily with a meal. 05/14/16   Deatra CanterWilliam J Tiaunna Buford, FNP  sulfamethoxazole-trimethoprim (BACTRIM DS,SEPTRA DS) 800-160 MG tablet Take 1 tablet by mouth 2 (two) times daily. 05/14/16 05/21/16  Deatra CanterWilliam J Armella Stogner, FNP   Meds Ordered and Administered this Visit  Medications - No data to display  BP 136/84 (BP Location: Left Arm)   Pulse 85   Temp 98.8 F (37.1 C) (Oral)   Resp 20   SpO2 100%  No data found.   Physical Exam  Constitutional: He appears well-developed and well-nourished.  HENT:  Head: Normocephalic and atraumatic.  Right Ear: External ear normal.  Left Ear: External ear normal.  Mouth/Throat: Oropharynx is clear and moist.  Right upper jaw swollen and tender and tenderness in right submandibular region.  Eyes: Conjunctivae and EOM are normal. Pupils are equal, round, and reactive to light.  Neck: Normal range of motion. Neck supple.  Cardiovascular: Normal rate, regular rhythm  and normal heart sounds.   Pulmonary/Chest: Effort normal and breath sounds normal.  Abdominal: Soft. Bowel sounds are normal.  Nursing note and vitals reviewed.   Urgent Care Course     Procedures (including critical care time)  Labs Review Labs Reviewed - No data to display  Imaging Review No results found.   Visual Acuity Review  Right Eye Distance:   Left Eye Distance:   Bilateral Distance:    Right Eye Near:   Left Eye Near:    Bilateral Near:         MDM    1. Parotitis   2. Acute nasopharyngitis    Bactrim DS one po bid x 7 days #14 Naprosyn 500mg  one po bid x 7 days #14  Discussed sucking on lemon and massaging right jaw and swollen area  Note for work.      Deatra Canter, FNP 05/14/16 [phone]

## 2017-12-21 ENCOUNTER — Ambulatory Visit (HOSPITAL_COMMUNITY)
Admission: EM | Admit: 2017-12-21 | Discharge: 2017-12-21 | Disposition: A | Discharge status: Home / Self Care | Payer: 59 | Attending: Family Medicine | Admitting: Family Medicine

## 2017-12-21 ENCOUNTER — Encounter (HOSPITAL_COMMUNITY): Payer: Self-pay | Admitting: Emergency Medicine

## 2017-12-21 DIAGNOSIS — M545 Low back pain: Secondary | ICD-10-CM

## 2017-12-21 DIAGNOSIS — S39012A Strain of muscle, fascia and tendon of lower back, initial encounter: Secondary | ICD-10-CM | POA: Diagnosis not present

## 2017-12-21 LAB — POCT URINALYSIS DIP (DEVICE)
BILIRUBIN URINE: NEGATIVE
Glucose, UA: NEGATIVE mg/dL
Hgb urine dipstick: NEGATIVE
KETONES UR: NEGATIVE mg/dL
LEUKOCYTES UA: NEGATIVE
NITRITE: NEGATIVE
PH: 6 (ref 5.0–8.0)
Protein, ur: NEGATIVE mg/dL
Specific Gravity, Urine: 1.025 (ref 1.005–1.030)
Urobilinogen, UA: 1 mg/dL (ref 0.0–1.0)

## 2017-12-21 LAB — GLUCOSE, CAPILLARY: Glucose-Capillary: 90 mg/dL (ref 70–99)

## 2017-12-21 MED ORDER — DICLOFENAC SODIUM 75 MG PO TBEC: 75.0000 mg | DELAYED_RELEASE_TABLET | Freq: Two times a day (BID) | ORAL | 0 refills | Status: DC

## 2017-12-21 NOTE — ED Provider Notes (Signed)
[facility]   [id number] 12/21/17 Arrival Time: 1502  ASSESSMENT & PLAN:  1. Strain of lumbar region, initial encounter    No sign of kidney infection. He had expressed worry.  Meds ordered this encounter  Medications  . diclofenac (VOLTAREN) 75 MG EC tablet    Sig: Take 1 tablet (75 mg total) by mouth 2 (two) times daily.    Dispense:  14 tablet    Refill:  0   Encouraged ROM as he tolerates.  Follow-up Information    Renford Dills, MD.   Specialty:  Internal Medicine Why:  As needed. Contact information: [address] [place] [address] [phone]        [facility].   Specialty:  Urgent Care Why:  If symptoms worsen. Contact information: [address] [place] [address] [phone]         Reviewed expectations re: course of current medical issues. Questions answered. Outlined signs and symptoms indicating need for more acute intervention. Patient verbalized understanding. After Visit Summary given.   SUBJECTIVE: History from: patient.  Francis Coleman is a 57 y.o. male who presents with complaint of intermittent right sided lower back discomfort. Onset gradual beginning yesterday. Injury/trama: no. But does a lot of lifting at work. History of back problems: no. Discomfort described as aching without radiation. Certain movements exacerbate the described discomfort. Better with rest. Extremity sensation changes or weakness: none. Ambulatory without difficulty. Normal bowel/bladder habits. No associated abdominal pain/n/v. Self treatment: has tried nothing for pain relief.  Reports no fevers, IV drug use, or recent back surgeries or procedures.  ROS: As per HPI.   OBJECTIVE:  Vitals:   12/21/17 1530  BP: (!) 150/93  Pulse: 76  Resp: 16  Temp: 98.2 F (36.8 C)  TempSrc: Oral  SpO2: 97%    General appearance: alert; no distress Neck: supple with FROM; without  midline tenderness Lungs: unlabored respirations; symmetrical air entry Abdomen: soft, non-tender; bowel sounds normal; no masses or organomegaly; no guarding or rebound tenderness Back: right sided lower tenderness present over paraspinal musculature; FROM at hips with mild discomfort reported; bruising: none; without midline tenderness Extremities: no cyanosis or edema; symmetrical with no gross deformities Skin: warm and dry Neurologic: normal gait; normal symmetric reflexes; normal LE strength and sensation Psychological: alert and cooperative; normal mood and affect  Labs: Results for orders placed or performed during the hospital encounter of 12/21/17  Glucose, capillary  Result Value Ref Range   Glucose-Capillary 90 70 - 99 mg/dL  POCT urinalysis dip (device)  Result Value Ref Range   Glucose, UA NEGATIVE NEGATIVE mg/dL   Bilirubin Urine NEGATIVE NEGATIVE   Ketones, ur NEGATIVE NEGATIVE mg/dL   Specific Gravity, Urine 1.025 1.005 - 1.030   Hgb urine dipstick NEGATIVE NEGATIVE   pH 6.0 5.0 - 8.0   Protein, ur NEGATIVE NEGATIVE mg/dL   Urobilinogen, UA 1.0 0.0 - 1.0 mg/dL   Nitrite NEGATIVE NEGATIVE   Leukocytes, UA NEGATIVE NEGATIVE   Labs Reviewed  GLUCOSE, CAPILLARY  POCT URINALYSIS DIP (DEVICE)     No Known Allergies  Past Medical History:  Diagnosis Date  . GERD (gastroesophageal reflux disease)    past hx. -none recent-controls with diet.  . S/P epidural steroid injection    due to fall and" herniated disc"  . Sleep apnea    no use since 2010   Social History   Socioeconomic History  . Marital status:  Widowed    Spouse name: Not on file  . Number of children: Not on file  . Years of education: Not on file  . Highest education level: Not on file  Occupational History  . Not on file  Social Needs  . Financial resource strain: Not on file  . Food insecurity:    Worry: Not on file    Inability: Not on file  . Transportation needs:    Medical: Not on  file    Non-medical: Not on file  Tobacco Use  . Smoking status: Former Smoker    Packs/day: 0.25    Years: 2.00    Pack years: 0.50  . Smokeless tobacco: Former NeurosurgeonUser    Quit date: 08/23/1997  Substance and Sexual Activity  . Alcohol use: Yes    Comment: socially  . Drug use: No  . Sexual activity: Not on file  Lifestyle  . Physical activity:    Days per week: Not on file    Minutes per session: Not on file  . Stress: Not on file  Relationships  . Social connections:    Talks on phone: Not on file    Gets together: Not on file    Attends religious service: Not on file    Active member of club or organization: Not on file    Attends meetings of clubs or organizations: Not on file    Relationship status: Not on file  . Intimate partner violence:    Fear of current or ex partner: Not on file    Emotionally abused: Not on file    Physically abused: Not on file    Forced sexual activity: Not on file  Other Topics Concern  . Not on file  Social History Narrative  . Not on file   History reviewed. No pertinent family history. Past Surgical History:  Procedure Laterality Date  . CIRCUMCISION    . COLONOSCOPY WITH PROPOFOL N/A 09/03/2014   Procedure: COLONOSCOPY WITH PROPOFOL;  Surgeon: Charolett BumpersMartin K Johnson, MD;  Location: [facility];  Service: Endoscopy;  Laterality: N/AMardella Layman;     Paislie Tessler, MD 12/22/17 [phone]

## 2017-12-21 NOTE — ED Triage Notes (Signed)
Pt sts pain in lower back; pt unsure if could have "kidney infection"

## 2017-12-21 NOTE — Discharge Instructions (Signed)

## 2019-09-14 ENCOUNTER — Other Ambulatory Visit: Payer: Self-pay

## 2019-09-14 ENCOUNTER — Ambulatory Visit: Payer: Self-pay | Admitting: Physician Assistant

## 2019-09-14 VITALS — BP 135/96 | HR 85 | Temp 98.7°F | Resp 18 | Ht 64.0 in | Wt 200.0 lb

## 2019-09-14 DIAGNOSIS — Z1159 Encounter for screening for other viral diseases: Secondary | ICD-10-CM

## 2019-09-14 DIAGNOSIS — Z1152 Encounter for screening for COVID-19: Secondary | ICD-10-CM

## 2019-09-14 DIAGNOSIS — E6609 Other obesity due to excess calories: Secondary | ICD-10-CM

## 2019-09-14 DIAGNOSIS — Z6834 Body mass index (BMI) 34.0-34.9, adult: Secondary | ICD-10-CM

## 2019-09-14 DIAGNOSIS — R030 Elevated blood-pressure reading, without diagnosis of hypertension: Secondary | ICD-10-CM

## 2019-09-14 DIAGNOSIS — Z125 Encounter for screening for malignant neoplasm of prostate: Secondary | ICD-10-CM

## 2019-09-14 DIAGNOSIS — N529 Male erectile dysfunction, unspecified: Secondary | ICD-10-CM

## 2019-09-14 DIAGNOSIS — M25561 Pain in right knee: Secondary | ICD-10-CM

## 2019-09-14 DIAGNOSIS — Z13220 Encounter for screening for lipoid disorders: Secondary | ICD-10-CM

## 2019-09-14 LAB — POC COVID19 BINAXNOW: SARS Coronavirus 2 Ag: NEGATIVE

## 2019-09-14 MED ORDER — SILDENAFIL CITRATE 50 MG PO TABS: 50.0000 mg | ORAL_TABLET | Freq: Every day | ORAL | 0 refills | Status: DC | PRN

## 2019-09-14 NOTE — Progress Notes (Signed)
New Patient Office Visit  Subjective:  Patient ID: Francis Coleman, male    DOB: 1960/06/01  Age: 59 y.o. MRN: [id number]  CC:  Chief Complaint  Patient presents with  . Blood Pressure Check    HPI Francis Coleman requests a physical, states that he has not seen his primary care provider in the last year and a half, does have some concerns about his blood pressure and right knee pain, also requests a prescription for his erectile dysfunction.  Reports that he has not previously been diagnosed with hypertension, does not check his blood pressure at home, does endorse going to the dentist in December 2020 and not being able to be seen that day due to elevated blood pressure.  Reports that he did not follow-up with his primary care provider after the dental visit.  Reports he drinks approximately 2 bottles of water a day, does not follow a low-sodium diet.  Reports that he has been having intermittent right knee pain for the last 3 months.  Reports that his knee does seem swollen at times. denies injury or trauma, denies previous participation in athletics.  Works driving a truck for concrete delivery, does endorse doing some physical labor.  Has not tried anything for relief.  Reports that he has a history of erectile dysfunction, states that he has previously been prescribed Viagra by his primary care provider with relief.  Denies any adverse effects from Viagra  Reports that he has been waiting to have a sleep study completed, does endorse that he snores, has difficulty staying asleep due to waking himself from snoring.  Reports that he is going on vacation to the [place], was due to fly out this morning, unfortunately he did not get a Covid test completed as required, is requesting a rapid test be completed today, is planning to fly tomorrow.  Denies any Covid symptoms, has not been vaccinated against Covid.   Past Medical History:  Diagnosis Date  . GERD (gastroesophageal  reflux disease)    past hx. -none recent-controls with diet.  . S/P epidural steroid injection    due to fall and" herniated disc"  . Sleep apnea    no use since 2010    Past Surgical History:  Procedure Laterality Date  . CIRCUMCISION    . COLONOSCOPY WITH PROPOFOL N/A 09/03/2014   Procedure: COLONOSCOPY WITH PROPOFOL;  Surgeon: Charolett Bumpers, MD;  Location: [facility];  Service: Endoscopy;  Laterality: N/A;    History reviewed. No pertinent family history.  Social History   Socioeconomic History  . Marital status: Widowed    Spouse name: Not on file  . Number of children: Not on file  . Years of education: Not on file  . Highest education level: Not on file  Occupational History  . Not on file  Tobacco Use  . Smoking status: Former Smoker    Packs/day: 0.25    Years: 2.00    Pack years: 0.50  . Smokeless tobacco: Former Neurosurgeon    Quit date: 08/23/1997  Substance and Sexual Activity  . Alcohol use: Yes    Comment: socially  . Drug use: No  . Sexual activity: Not Currently  Other Topics Concern  . Not on file  Social History Narrative  . Not on file   Social Determinants of Health   Financial Resource Strain:   . Difficulty of Paying Living Expenses:   Food Insecurity:   . Worried About Programme researcher, broadcasting/film/video in  the Last Year:   . Ran Out of Food in the Last Year:   Transportation Needs:   . Freight forwarder (Medical):   Marland Kitchen Lack of Transportation (Non-Medical):   Physical Activity:   . Days of Exercise per Week:   . Minutes of Exercise per Session:   Stress:   . Feeling of Stress :   Social Connections:   . Frequency of Communication with Friends and Family:   . Frequency of Social Gatherings with Friends and Family:   . Attends Religious Services:   . Active Member of Clubs or Organizations:   . Attends Banker Meetings:   Marland Kitchen Marital Status:   Intimate Partner Violence:   . Fear of Current or Ex-Partner:   . Emotionally Abused:   Marland Kitchen  Physically Abused:   . Sexually Abused:     ROS Review of Systems  Constitutional: Negative.   HENT: Negative.   Eyes: Negative.   Respiratory: Negative.   Cardiovascular: Negative.   Gastrointestinal: Negative.   Endocrine: Negative.   Genitourinary: Negative.   Musculoskeletal: Negative.   Skin: Negative.   Allergic/Immunologic: Negative.   Neurological: Negative.   Hematological: Negative.   Psychiatric/Behavioral: Positive for sleep disturbance.    Objective:   Today's Vitals: BP (!) 135/96 (BP Location: Left Arm, Patient Position: Sitting, Cuff Size: Large)   Pulse 85   Temp 98.7 F (37.1 C) (Oral)   Resp 18   Ht 5\' 4"  (1.626 m)   Wt 200 lb (90.7 kg)   SpO2 97%   BMI 34.33 kg/m   Physical Exam Vitals and nursing note reviewed.  Constitutional:      General: He is not in acute distress.    Appearance: Normal appearance. He is obese. He is not ill-appearing.  HENT:     Head: Normocephalic and atraumatic.     Right Ear: External ear normal.     Left Ear: External ear normal.     Mouth/Throat:     Mouth: Mucous membranes are moist.     Pharynx: Oropharynx is clear.  Eyes:     Extraocular Movements: Extraocular movements intact.     Conjunctiva/sclera: Conjunctivae normal.     Pupils: Pupils are equal, round, and reactive to light.  Cardiovascular:     Rate and Rhythm: Normal rate and regular rhythm.     Pulses: Normal pulses.     Heart sounds: Normal heart sounds.  Pulmonary:     Effort: Pulmonary effort is normal.     Breath sounds: Normal breath sounds.  Abdominal:     General: Abdomen is flat. Bowel sounds are normal.     Palpations: Abdomen is soft.  Musculoskeletal:        General: Normal range of motion.     Cervical back: Normal range of motion and neck supple.     Right knee: Crepitus present. No swelling. Normal range of motion. No tenderness.     Left knee: Normal.  Skin:    General: Skin is warm and dry.  Neurological:     General: No  focal deficit present.     Mental Status: He is alert and oriented to person, place, and time.  Psychiatric:        Mood and Affect: Mood normal.        Behavior: Behavior normal.        Thought Content: Thought content normal.        Judgment: Judgment normal.     Assessment &  Plan:   Problem List Items Addressed This Visit      Other   Erectile dysfunction   Relevant Medications   sildenafil (VIAGRA) 50 MG tablet    Other Visit Diagnoses    Elevated blood pressure reading without diagnosis of hypertension    -  Primary   Relevant Orders   TSH   Class 1 obesity due to excess calories with body mass index (BMI) of 34.0 to 34.9 in adult, unspecified whether serious comorbidity present       Relevant Orders   CBC with Differential/Platelet   Comp. Metabolic Panel (12)   TSH   Vitamin D, 25-hydroxy   Screening, lipid       Relevant Orders   Lipid panel   Screening for prostate cancer       Relevant Orders   PSA   Encounter for screening for COVID-19       Relevant Orders   POC COVID-19 (Completed)   Acute pain of right knee       Encounter for HCV screening test for low risk patient       Relevant Orders   Hepatitis c antibody (reflex)      Outpatient Encounter Medications as of 09/14/2019  Medication Sig  . cyclobenzaprine (FLEXERIL) 10 MG tablet Take 5-10 mg by mouth 3 (three) times daily as needed for muscle spasms.  . diclofenac (VOLTAREN) 75 MG EC tablet Take 1 tablet (75 mg total) by mouth 2 (two) times daily. (Patient not taking: Reported on 09/14/2019)  . sildenafil (VIAGRA) 50 MG tablet Take 1 tablet (50 mg total) by mouth daily as needed for up to 10 doses for erectile dysfunction.   No facility-administered encounter medications on file as of 09/14/2019.  1. Elevated blood pressure reading without diagnosis of hypertension Encourage patient to check blood pressure at home, keep readings log and bring to his primary care provider or return to mobile unit for  further evaluation.  Gave patient education on low-sodium diet, increase hydration, improve sleep quality, strongly encouraged following up on sleep study. - TSH  2. Class 1 obesity due to excess calories with body mass index (BMI) of 34.0 to 34.9 in adult, unspecified whether serious comorbidity present Encourage patient to work on weight loss efforts for overall general health improvement - CBC with Differential/Platelet - Comp. Metabolic Panel (12) - TSH - Vitamin D, 25-hydroxy  3. Screening, lipid  - Lipid panel  4. Screening for prostate cancer  - PSA  5. Erectile dysfunction, unspecified erectile dysfunction type  - sildenafil (VIAGRA) 50 MG tablet; Take 1 tablet (50 mg total) by mouth daily as needed for up to 10 doses for erectile dysfunction.  Dispense: 10 tablet; Refill: 0  6. Encounter for screening for COVID-19  - POC COVID-19  7. Acute pain of right knee Gave patient education on rest, ice, compression, over-the-counter pain relievers as needed.  8. Encounter for HCV screening test for low risk patient  - Hepatitis c antibody (reflex)   I have reviewed the patient's medical history (PMH, PSH, Social History, Family History, Medications, and allergies) , and have been updated if relevant. I spent 45 minutes reviewing chart and  face to face time with patient.     Follow-up: Return in about 1 week (around 09/21/2019) for for blood pressure check.   Loraine Grip Mayers, PA-C

## 2019-09-14 NOTE — Patient Instructions (Addendum)
We are screening you today for prostate cancer, kidney liver function, high cholesterol, hepatitis C, vitamin D deficiency, and thyroid disorder.  The results of your lab work will be available soon, you will be able to view these under your MyChart app, we will also call you with the results.  Your blood pressure was elevated today, I highly recommend that you purchase a blood pressure cuff, check your blood pressure at home, keep a written log and bring that with you to either your primary care provider or return to the mobile unit with your readings's for further evaluation.  I am enclosing information about a low-sodium diet, increasing your water intake.  I also highly recommend that you follow through with your sleep study, uncontrolled sleep apnea can also add to heart disease.  For your right knee pain, I recommend that you wear a brace while you are doing any type of physical activity, ice, and over-the-counter pain relievers can be helpful as well.  If this does not improve, I recommend that you follow-up with orthopedics for further evaluation.  I have sent a prescription for Viagra to your pharmacy, only use as directed.  Thank you for trusting [place] with your care, I hope that you have an enjoyable vacation.  Kennieth Rad, PA-C Physician Assistant [place] [email or web address]   DASH Eating Plan DASH stands for "Dietary Approaches to Stop Hypertension." The DASH eating plan is a healthy eating plan that has been shown to reduce high blood pressure (hypertension). It may also reduce your risk for type 2 diabetes, heart disease, and stroke. The DASH eating plan may also help with weight loss. What are tips for following this plan?  General guidelines  Avoid eating more than 2,300 mg (milligrams) of salt (sodium) a day. If you have hypertension, you may need to reduce your sodium intake to 1,500 mg a day.  Limit alcohol intake  to no more than 1 drink a day for nonpregnant women and 2 drinks a day for men. One drink equals 12 oz of beer, 5 oz of wine, or 1 oz of hard liquor.  Work with your health care provider to maintain a healthy body weight or to lose weight. Ask what an ideal weight is for you.  Get at least 30 minutes of exercise that causes your heart to beat faster (aerobic exercise) most days of the week. Activities may include walking, swimming, or biking.  Work with your health care provider or diet and nutrition specialist (dietitian) to adjust your eating plan to your individual calorie needs. Reading food labels   Check food labels for the amount of sodium per serving. Choose foods with less than 5 percent of the Daily Value of sodium. Generally, foods with less than 300 mg of sodium per serving fit into this eating plan.  To find whole grains, look for the word "whole" as the first word in the ingredient list. Shopping  Buy products labeled as "low-sodium" or "no salt added."  Buy fresh foods. Avoid canned foods and premade or frozen meals. Cooking  Avoid adding salt when cooking. Use salt-free seasonings or herbs instead of table salt or sea salt. Check with your health care provider or pharmacist before using salt substitutes.  Do not fry foods. Cook foods using healthy methods such as baking, boiling, grilling, and broiling instead.  Cook with heart-healthy oils, such as olive, canola, soybean, or sunflower oil. Meal planning  Eat a balanced diet that includes: ? 5 or  more servings of fruits and vegetables each day. At each meal, try to fill half of your plate with fruits and vegetables. ? Up to 6-8 servings of whole grains each day. ? Less than 6 oz of lean meat, poultry, or fish each day. A 3-oz serving of meat is about the same size as a deck of cards. One egg equals 1 oz. ? 2 servings of low-fat dairy each day. ? A serving of nuts, seeds, or beans 5 times each week. ? Heart-healthy  fats. Healthy fats called Omega-3 fatty acids are found in foods such as flaxseeds and coldwater fish, like sardines, salmon, and mackerel.  Limit how much you eat of the following: ? Canned or prepackaged foods. ? Food that is high in trans fat, such as fried foods. ? Food that is high in saturated fat, such as fatty meat. ? Sweets, desserts, sugary drinks, and other foods with added sugar. ? Full-fat dairy products.  Do not salt foods before eating.  Try to eat at least 2 vegetarian meals each week.  Eat more home-cooked food and less restaurant, buffet, and fast food.  When eating at a restaurant, ask that your food be prepared with less salt or no salt, if possible. What foods are recommended? The items listed may not be a complete list. Talk with your dietitian about what dietary choices are best for you. Grains Whole-grain or whole-wheat bread. Whole-grain or whole-wheat pasta. Brown rice. Orpah Cobb. Bulgur. Whole-grain and low-sodium cereals. Pita bread. Low-fat, low-sodium crackers. Whole-wheat flour tortillas. Vegetables Fresh or frozen vegetables (raw, steamed, roasted, or grilled). Low-sodium or reduced-sodium tomato and vegetable juice. Low-sodium or reduced-sodium tomato sauce and tomato paste. Low-sodium or reduced-sodium canned vegetables. Fruits All fresh, dried, or frozen fruit. Canned fruit in natural juice (without added sugar). Meat and other protein foods Skinless chicken or [place]. Ground chicken or [place]. Pork with fat trimmed off. Fish and seafood. Egg whites. Dried beans, peas, or lentils. Unsalted nuts, nut butters, and seeds. Unsalted canned beans. Lean cuts of beef with fat trimmed off. Low-sodium, lean deli meat. Dairy Low-fat (1%) or fat-free (skim) milk. Fat-free, low-fat, or reduced-fat cheeses. Nonfat, low-sodium ricotta or cottage cheese. Low-fat or nonfat yogurt. Low-fat, low-sodium cheese. Fats and oils Soft margarine without trans fats.  Vegetable oil. Low-fat, reduced-fat, or light mayonnaise and salad dressings (reduced-sodium). Canola, safflower, olive, soybean, and sunflower oils. Avocado. Seasoning and other foods Herbs. Spices. Seasoning mixes without salt. Unsalted popcorn and pretzels. Fat-free sweets. What foods are not recommended? The items listed may not be a complete list. Talk with your dietitian about what dietary choices are best for you. Grains Baked goods made with fat, such as croissants, muffins, or some breads. Dry pasta or rice meal packs. Vegetables Creamed or fried vegetables. Vegetables in a cheese sauce. Regular canned vegetables (not low-sodium or reduced-sodium). Regular canned tomato sauce and paste (not low-sodium or reduced-sodium). Regular tomato and vegetable juice (not low-sodium or reduced-sodium). Rosita Fire. Olives. Fruits Canned fruit in a light or heavy syrup. Fried fruit. Fruit in cream or butter sauce. Meat and other protein foods Fatty cuts of meat. Ribs. Fried meat. Tomasa Blase. Sausage. Bologna and other processed lunch meats. Salami. Fatback. Hotdogs. Bratwurst. Salted nuts and seeds. Canned beans with added salt. Canned or smoked fish. Whole eggs or egg yolks. Chicken or [place] with skin. Dairy Whole or 2% milk, cream, and half-and-half. Whole or full-fat cream cheese. Whole-fat or sweetened yogurt. Full-fat cheese. Nondairy creamers. Whipped toppings. Processed cheese and  cheese spreads. Fats and oils Butter. Stick margarine. Lard. Shortening. Ghee. Bacon fat. Tropical oils, such as coconut, palm kernel, or palm oil. Seasoning and other foods Salted popcorn and pretzels. Onion salt, garlic salt, seasoned salt, table salt, and sea salt. Worcestershire sauce. Tartar sauce. Barbecue sauce. Teriyaki sauce. Soy sauce, including reduced-sodium. Steak sauce. Canned and packaged gravies. Fish sauce. Oyster sauce. Cocktail sauce. Horseradish that you find on the shelf. Ketchup. Mustard. Meat flavorings  and tenderizers. Bouillon cubes. Hot sauce and Tabasco sauce. Premade or packaged marinades. Premade or packaged taco seasonings. Relishes. Regular salad dressings. Where to find more information:  National Heart, Lung, and Blood Institute: PopSteam.is  American Heart Association: www.heart.org Summary  The DASH eating plan is a healthy eating plan that has been shown to reduce high blood pressure (hypertension). It may also reduce your risk for type 2 diabetes, heart disease, and stroke.  With the DASH eating plan, you should limit salt (sodium) intake to 2,300 mg a day. If you have hypertension, you may need to reduce your sodium intake to 1,500 mg a day.  When on the DASH eating plan, aim to eat more fresh fruits and vegetables, whole grains, lean proteins, low-fat dairy, and heart-healthy fats.  Work with your health care provider or diet and nutrition specialist (dietitian) to adjust your eating plan to your individual calorie needs. This information is not intended to replace advice given to you by your health care provider. Make sure you discuss any questions you have with your health care provider. Document Revised: 03/26/2017 Document Reviewed: 04/06/2016 Elsevier Patient Education  2020 Elsevier Inc.   Acute Knee Pain, Adult Acute knee pain is sudden and may be caused by damage, swelling, or irritation of the muscles and tissues that support your knee. The injury may result from:  A fall.  An injury to your knee from twisting motions.  A hit to the knee.  Infection. Acute knee pain may go away on its own with time and rest. If it does not, your health care provider may order tests to find the cause of the pain. These may include:  Imaging tests, such as an X-ray, MRI, or ultrasound.  Joint aspiration. In this test, fluid is removed from the knee.  Arthroscopy. In this test, a lighted tube is inserted into the knee and an image is projected onto a TV  screen.  Biopsy. In this test, a sample of tissue is removed from the body and studied under a microscope. Follow these instructions at home: Pay attention to any changes in your symptoms. Take these actions to relieve your pain. If you have a knee sleeve or brace:   Wear the sleeve or brace as told by your health care provider. Remove it only as told by your health care provider.  Loosen the sleeve or brace if your toes tingle, become numb, or turn cold and blue.  Keep the sleeve or brace clean.  If the sleeve or brace is not waterproof: ? Do not let it get wet. ? Cover it with a watertight covering when you take a bath or shower. Activity  Rest your knee.  Do not do things that cause pain or make pain worse.  Avoid high-impact activities or exercises, such as running, jumping rope, or doing jumping jacks.  Work with a physical therapist to make a safe exercise program, as recommended by your health care provider. Do exercises as told by your physical therapist. Managing pain, stiffness, and swelling  If directed, put ice on the knee: ? Put ice in a plastic bag. ? Place a towel between your skin and the bag. ? Leave the ice on for 20 minutes, 2-3 times a day.  If directed, use an elastic bandage to put pressure (compression) on your injured knee. This may control swelling, give support, and help with discomfort. General instructions  Take over-the-counter and prescription medicines only as told by your health care provider.  Raise (elevate) your knee above the level of your heart when you are sitting or lying down.  Sleep with a pillow under your knee.  Do not use any products that contain nicotine or tobacco, such as cigarettes, e-cigarettes, and chewing tobacco. These can delay healing. If you need help quitting, ask your health care provider.  If you are overweight, work with your health care provider and a dietitian to set a weight-loss goal that is healthy and  reasonable for you. Extra weight can put pressure on your knee.  Keep all follow-up visits as told by your health care provider. This is important. Contact a health care provider if:  Your knee pain continues, changes, or gets worse.  You have a fever along with knee pain.  Your knee feels warm to the touch.  Your knee buckles or locks up. Get help right away if:  Your knee swells, and the swelling becomes worse.  You cannot move your knee.  You have severe pain in your knee. Summary  Acute knee pain can be caused by a fall, an injury, an infection, or damage, swelling, or irritation of the tissues that support your knee.  Your health care provider may perform tests to find out the cause of the pain.  Pay attention to any changes in your symptoms. Relieve your pain with rest, medicines, light activity, and use of ice.  Get help if your pain continues or becomes worse, your knee swells, or you cannot move your knee. This information is not intended to replace advice given to you by your health care provider. Make sure you discuss any questions you have with your health care provider. Document Revised: 09/23/2017 Document Reviewed: 09/23/2017 Elsevier Patient Education  2020 [employer].

## 2019-09-15 LAB — COMP. METABOLIC PANEL (12)
AST: 22 IU/L (ref 0–40)
Albumin/Globulin Ratio: 1.7 (ref 1.2–2.2)
Albumin: 4.6 g/dL (ref 3.8–4.9)
Alkaline Phosphatase: 61 IU/L (ref 48–121)
BUN/Creatinine Ratio: 17 (ref 9–20)
BUN: 17 mg/dL (ref 6–24)
Bilirubin Total: 0.2 mg/dL (ref 0.0–1.2)
Calcium: 9.8 mg/dL (ref 8.7–10.2)
Chloride: 101 mmol/L (ref 96–106)
Creatinine, Ser: 1.03 mg/dL (ref 0.76–1.27)
GFR calc Af Amer: 91 mL/min/{1.73_m2} (ref >59)
GFR calc non Af Amer: 79 mL/min/{1.73_m2} (ref >59)
Globulin, Total: 2.7 g/dL (ref 1.5–4.5)
Glucose: 81 mg/dL (ref 65–99)
Potassium: 4.2 mmol/L (ref 3.5–5.2)
Sodium: 137 mmol/L (ref 134–144)
Total Protein: 7.3 g/dL (ref 6.0–8.5)

## 2019-09-15 LAB — CBC WITH DIFFERENTIAL/PLATELET
Basophils Absolute: 0.1 10*3/uL (ref 0.0–0.2)
Basos: 1 %
EOS (ABSOLUTE): 0.1 10*3/uL (ref 0.0–0.4)
Eos: 2 %
Hematocrit: 43.2 % (ref 37.5–51.0)
Hemoglobin: 14.1 g/dL (ref 13.0–17.7)
Immature Grans (Abs): 0 10*3/uL (ref 0.0–0.1)
Immature Granulocytes: 1 %
Lymphocytes Absolute: 1.6 10*3/uL (ref 0.7–3.1)
Lymphs: 25 %
MCH: 27.5 pg (ref 26.6–33.0)
MCHC: 32.6 g/dL (ref 31.5–35.7)
MCV: 84 fL (ref 79–97)
Monocytes Absolute: 0.6 10*3/uL (ref 0.1–0.9)
Monocytes: 10 %
Neutrophils Absolute: 4 10*3/uL (ref 1.4–7.0)
Neutrophils: 61 %
Platelets: 267 10*3/uL (ref 150–450)
RBC: 5.13 x10E6/uL (ref 4.14–5.80)
RDW: 14.6 % (ref 11.6–15.4)
WBC: 6.4 10*3/uL (ref 3.4–10.8)

## 2019-09-15 LAB — LIPID PANEL
Chol/HDL Ratio: 4 ratio (ref 0.0–5.0)
Cholesterol, Total: 201 mg/dL — ABNORMAL HIGH (ref 100–199)
HDL: 50 mg/dL (ref >39)
LDL Chol Calc (NIH): 136 mg/dL — ABNORMAL HIGH (ref 0–99)
Triglycerides: 80 mg/dL (ref 0–149)
VLDL Cholesterol Cal: 15 mg/dL (ref 5–40)

## 2019-09-15 LAB — PSA: Prostate Specific Ag, Serum: 2.9 ng/mL (ref 0.0–4.0)

## 2019-09-15 LAB — TSH: TSH: 1.35 u[IU]/mL (ref 0.450–4.500)

## 2019-09-15 LAB — HEPATITIS C ANTIBODY (REFLEX): HCV Ab: <0.1 s/co ratio (ref 0.0–0.9)

## 2019-09-15 LAB — VITAMIN D 25 HYDROXY (VIT D DEFICIENCY, FRACTURES): Vit D, 25-Hydroxy: 25.7 ng/mL — ABNORMAL LOW (ref 30.0–100.0)

## 2019-09-15 LAB — HCV COMMENT:

## 2019-09-18 ENCOUNTER — Other Ambulatory Visit: Payer: Self-pay | Admitting: Physician Assistant

## 2019-09-18 ENCOUNTER — Encounter: Payer: Self-pay | Admitting: Physician Assistant

## 2019-09-18 DIAGNOSIS — E785 Hyperlipidemia, unspecified: Secondary | ICD-10-CM

## 2019-09-18 MED ORDER — ATORVASTATIN CALCIUM 10 MG PO TABS: 10.0000 mg | ORAL_TABLET | Freq: Every day | ORAL | 3 refills | Status: DC

## 2019-09-18 NOTE — Telephone Encounter (Signed)
-----   Message from Roney Jaffe, [place] sent at 09/18/2019  9:29 AM EDT ----- Please call patient and let him know that his cholesterol was elevated, his overall risk of a heart attack or stroke in the next 10 years is 8.7%, it is recommended that he start taking medication to help lower this risk.  I have sent a prescription for a statin to his pharmacy.  He should take this at bedtime.  He should also work on a low-cholesterol diet.  I will send him information regarding Mediterranean style diet through his MyChart accountHis vitamin D levels were also low, he should take 2000 international units over-the-counter on a daily basis.Kidney and liver function, thyroid were all within normal limits, screening for prostate cancer was negative, screening for hepatitis C was negative.Thank you

## 2019-09-18 NOTE — Progress Notes (Unsigned)
tor

## 2019-09-21 ENCOUNTER — Ambulatory Visit: Payer: 59 | Admitting: Physician Assistant

## 2019-09-21 ENCOUNTER — Other Ambulatory Visit: Payer: Self-pay

## 2019-09-21 VITALS — BP 151/98 | HR 80 | Temp 98.3°F | Resp 18 | Ht 67.0 in | Wt 230.0 lb

## 2019-09-21 DIAGNOSIS — E785 Hyperlipidemia, unspecified: Secondary | ICD-10-CM | POA: Diagnosis not present

## 2019-09-21 DIAGNOSIS — H9201 Otalgia, right ear: Secondary | ICD-10-CM | POA: Diagnosis not present

## 2019-09-21 DIAGNOSIS — I10 Essential (primary) hypertension: Secondary | ICD-10-CM

## 2019-09-21 DIAGNOSIS — E559 Vitamin D deficiency, unspecified: Secondary | ICD-10-CM | POA: Diagnosis not present

## 2019-09-21 DIAGNOSIS — E6609 Other obesity due to excess calories: Secondary | ICD-10-CM

## 2019-09-21 DIAGNOSIS — Z6834 Body mass index (BMI) 34.0-34.9, adult: Secondary | ICD-10-CM

## 2019-09-21 MED ORDER — LISINOPRIL-HYDROCHLOROTHIAZIDE 10-12.5 MG PO TABS: 1.0000 | ORAL_TABLET | Freq: Every day | ORAL | 3 refills | Status: DC

## 2019-09-21 NOTE — Progress Notes (Signed)
Established Patient Office Visit  Subjective:  Patient ID: Francis Coleman, male    DOB: 1961/04/10  Age: 59 y.o. MRN: [id number]  CC:  Chief Complaint  Patient presents with  . Sore Throat    right side  . Ear Pain    right side    HPI Francis Coleman reports that he did not check his blood pressure at home since his last office visit.  Denies any hypertensive symptoms at this time.  Reports that he has been having some pain in his right ear, states it started last night.  Does endorse that he flew back from the [place] on Monday.  Denies any discharge, denies any other URI symptoms.  Has not tried anything for relief.      Past Medical History:  Diagnosis Date  . GERD (gastroesophageal reflux disease)    past hx. -none recent-controls with diet.  . Hypertension   . S/P epidural steroid injection    due to fall and" herniated disc"  . Sleep apnea    no use since 2010    Past Surgical History:  Procedure Laterality Date  . CIRCUMCISION    . COLONOSCOPY WITH PROPOFOL N/A 09/03/2014   Procedure: COLONOSCOPY WITH PROPOFOL;  Surgeon: Charolett Bumpers, MD;  Location: [facility];  Service: Endoscopy;  Laterality: N/A;    History reviewed. No pertinent family history.  Social History   Socioeconomic History  . Marital status: Widowed    Spouse name: Not on file  . Number of children: Not on file  . Years of education: Not on file  . Highest education level: Not on file  Occupational History  . Not on file  Tobacco Use  . Smoking status: Former Smoker    Packs/day: 0.25    Years: 2.00    Pack years: 0.50  . Smokeless tobacco: Former Neurosurgeon    Quit date: 08/23/1997  Substance and Sexual Activity  . Alcohol use: Yes    Comment: socially  . Drug use: No  . Sexual activity: Not on file  Other Topics Concern  . Not on file  Social History Narrative  . Not on file   Social Determinants of Health   Financial Resource Strain:   . Difficulty of Paying  Living Expenses:   Food Insecurity:   . Worried About Programme researcher, broadcasting/film/video in the Last Year:   . Barista in the Last Year:   Transportation Needs:   . Freight forwarder (Medical):   Marland Kitchen Lack of Transportation (Non-Medical):   Physical Activity:   . Days of Exercise per Week:   . Minutes of Exercise per Session:   Stress:   . Feeling of Stress :   Social Connections:   . Frequency of Communication with Friends and Family:   . Frequency of Social Gatherings with Friends and Family:   . Attends Religious Services:   . Active Member of Clubs or Organizations:   . Attends Banker Meetings:   Marland Kitchen Marital Status:   Intimate Partner Violence:   . Fear of Current or Ex-Partner:   . Emotionally Abused:   Marland Kitchen Physically Abused:   . Sexually Abused:     Outpatient Medications Prior to Visit  Medication Sig Dispense Refill  . atorvastatin (LIPITOR) 10 MG tablet Take 1 tablet (10 mg total) by mouth daily. 90 tablet 3  . cyclobenzaprine (FLEXERIL) 10 MG tablet Take 5-10 mg by mouth 3 (three) times daily as  needed for muscle spasms.    . sildenafil (VIAGRA) 50 MG tablet Take 1 tablet (50 mg total) by mouth daily as needed for up to 10 doses for erectile dysfunction. 10 tablet 0  . diclofenac (VOLTAREN) 75 MG EC tablet Take 1 tablet (75 mg total) by mouth 2 (two) times daily. (Patient not taking: Reported on 09/14/2019) 14 tablet 0   No facility-administered medications prior to visit.    No Known Allergies  ROS Review of Systems  Constitutional: Negative.  Negative for chills and fever.  HENT: Positive for ear pain. Negative for sinus pain, sneezing and sore throat.   Eyes: Negative.   Respiratory: Negative.   Cardiovascular: Negative.   Gastrointestinal: Negative.   Endocrine: Negative.   Genitourinary: Negative.   Musculoskeletal: Negative.   Skin: Negative.   Allergic/Immunologic: Negative.   Neurological: Negative.   Hematological: Negative.    Psychiatric/Behavioral: Negative.       Objective:    Physical Exam  Constitutional: He is oriented to person, place, and time. He appears well-developed and well-nourished.  HENT:  Head: Normocephalic.  Right Ear: Tympanic membrane, external ear and ear canal normal.  Left Ear: Tympanic membrane, external ear and ear canal normal.  Nose: Nose normal.  Mouth/Throat: Oropharynx is clear and moist.  Eyes: Pupils are equal, round, and reactive to light. Conjunctivae are normal.  Cardiovascular: Normal rate, regular rhythm and normal heart sounds.  Pulmonary/Chest: Effort normal and breath sounds normal.  Abdominal: Soft. Bowel sounds are normal.  Musculoskeletal:        General: Normal range of motion.     Cervical back: Normal range of motion and neck supple.  Lymphadenopathy:    He has no cervical adenopathy.  Neurological: He is alert and oriented to person, place, and time. He has normal reflexes.  Skin: Skin is warm and dry.  Psychiatric: He has a normal mood and affect. His behavior is normal. Judgment and thought content normal.  Nursing note and vitals reviewed.   BP (!) 151/98 (BP Location: Left Arm, Patient Position: Sitting, Cuff Size: Large)   Pulse 80   Temp 98.3 F (36.8 C) (Oral)   Resp 18   Ht 5\' 7"  (1.702 m)   Wt 230 lb (104.3 kg)   SpO2 96%   BMI 36.02 kg/m  Wt Readings from Last 3 Encounters:  09/21/19 230 lb (104.3 kg)  09/14/19 200 lb (90.7 kg)  09/03/14 183 lb (83 kg)     Health Maintenance Due  Topic Date Due  . COVID-19 Vaccine (1) Never done  . HIV Screening  Never done  . TETANUS/TDAP  Never done    There are no preventive care reminders to display for this patient.  Lab Results  Component Value Date   TSH 1.350 09/14/2019   Lab Results  Component Value Date   WBC 6.4 09/14/2019   HGB 14.1 09/14/2019   HCT 43.2 09/14/2019   MCV 84 09/14/2019   PLT 267 09/14/2019   Lab Results  Component Value Date   NA 137 09/14/2019   K  4.2 09/14/2019   CO2 25 06/20/2013   GLUCOSE 81 09/14/2019   BUN 17 09/14/2019   CREATININE 1.03 09/14/2019   BILITOT 0.2 09/14/2019   ALKPHOS 61 09/14/2019   AST 22 09/14/2019   ALT 31 04/16/2009   PROT 7.3 09/14/2019   ALBUMIN 4.6 09/14/2019   CALCIUM 9.8 09/14/2019   Lab Results  Component Value Date   CHOL 201 (H) 09/14/2019  Lab Results  Component Value Date   HDL 50 09/14/2019   Lab Results  Component Value Date   LDLCALC 136 (H) 09/14/2019   Lab Results  Component Value Date   TRIG 80 09/14/2019   Lab Results  Component Value Date   CHOLHDL 4.0 09/14/2019   No results found for: HGBA1C    Assessment & Plan:   Problem List Items Addressed This Visit    None    Visit Diagnoses    Essential hypertension    -  Primary   Relevant Medications   lisinopril-hydrochlorothiazide (ZESTORETIC) 10-12.5 MG tablet   Hyperlipidemia, unspecified hyperlipidemia type       Relevant Medications   lisinopril-hydrochlorothiazide (ZESTORETIC) 10-12.5 MG tablet   Vitamin D deficiency       Right ear pain       Class 1 obesity due to excess calories with body mass index (BMI) of 34.0 to 34.9 in adult, unspecified whether serious comorbidity present        1. Essential hypertension Encourage patient to do trial of lisinopril, hydrochlorothiazide combo, check blood pressure on a daily basis, had previously sent information regarding Mediterranean style diet to patient via MyChart.  Patient is unsatisfied with current primary care provider, appointment was made to establish primary care on November 09, 2019 at [facility].   - lisinopril-hydrochlorothiazide (ZESTORETIC) 10-12.5 MG tablet; Take 1 tablet by mouth daily.  Dispense: 90 tablet; Refill: 3  2. Hyperlipidemia, unspecified hyperlipidemia type Gave patient information on benefits of statin, his current 10-year risk of a heart attack or stroke, encouraged Mediterranean style diet, previously sent statin to his  pharmacy  3. Vitamin D deficiency Encouraged patient to review his current dosing of vitamin D and make sure he is taking at least 2000 units a day.  4. Right ear pain Encouraged trial of Zyrtec or Claritin over-the-counter, red flags given for prompt reevaluation  5. Class 1 obesity due to excess calories with body mass index (BMI) of 34.0 to 34.9 in adult, unspecified whether serious comorbidity present    I have reviewed the patient's medical history (PMH, PSH, Social History, Family History, Medications, and allergies) , and have been updated if relevant. I spent 30 minutes reviewing chart and  face to face time with patient.     Meds ordered this encounter  Medications  . lisinopril-hydrochlorothiazide (ZESTORETIC) 10-12.5 MG tablet    Sig: Take 1 tablet by mouth daily.    Dispense:  90 tablet    Refill:  3    Order Specific Question:   Supervising Provider    Answer:   Storm Frisk [[id number]]    Follow-up: Return in about 7 weeks (around 11/09/2019) for To establish PCP, with Dr. Earlene Plater at [facility].    Kasandra Knudsen Mayers, PA-C

## 2019-09-21 NOTE — Patient Instructions (Addendum)
For your blood pressure we are going to start a medication that is a combination of 2 blood pressure medications.  I recommend that you take this in the morning.  I also recommend that you check your blood pressure on a daily basis, keep a log of your blood pressure readings and take them with you to your primary care appointment.  I encourage you to take 2000 units of vitamin D on a daily basis, check your multivitamin to see what is already in it and supplement if needed.  I encourage you to start the cholesterol medication as we discussed, I also encourage you to work on lifestyle modifications including the Mediterranean style diet  I encourage you to do a trial of Zyrtec or Claritin over-the-counter to see if this helps improve your ear discomfort.  I hope that you feel better soon,  Kennieth Rad, PA-C Physician Assistant [place] [email or web address]    Managing Your Hypertension Hypertension is commonly called high blood pressure. This is when the force of your blood pressing against the walls of your arteries is too strong. Arteries are blood vessels that carry blood from your heart throughout your body. Hypertension forces the heart to work harder to pump blood, and may cause the arteries to become narrow or stiff. Having untreated or uncontrolled hypertension can cause heart attack, stroke, kidney disease, and other problems. What are blood pressure readings? A blood pressure reading consists of a higher number over a lower number. Ideally, your blood pressure should be below 120/80. The first ("top") number is called the systolic pressure. It is a measure of the pressure in your arteries as your heart beats. The second ("bottom") number is called the diastolic pressure. It is a measure of the pressure in your arteries as the heart relaxes. What does my blood pressure reading mean? Blood pressure is classified into four  stages. Based on your blood pressure reading, your health care provider may use the following stages to determine what type of treatment you need, if any. Systolic pressure and diastolic pressure are measured in a unit called mm Hg. Normal  Systolic pressure: below 254.  Diastolic pressure: below 80. Elevated  Systolic pressure: 270-623.  Diastolic pressure: below 80. Hypertension stage 1  Systolic pressure: 762-831.  Diastolic pressure: 51-76. Hypertension stage 2  Systolic pressure: 160 or above.  Diastolic pressure: 90 or above. What health risks are associated with hypertension? Managing your hypertension is an important responsibility. Uncontrolled hypertension can lead to:  A heart attack.  A stroke.  A weakened blood vessel (aneurysm).  Heart failure.  Kidney damage.  Eye damage.  Metabolic syndrome.  Memory and concentration problems. What changes can I make to manage my hypertension? Hypertension can be managed by making lifestyle changes and possibly by taking medicines. Your health care provider will help you make a plan to bring your blood pressure within a normal range. Eating and drinking   Eat a diet that is high in fiber and potassium, and low in salt (sodium), added sugar, and fat. An example eating plan is called the DASH (Dietary Approaches to Stop Hypertension) diet. To eat this way: ? Eat plenty of fresh fruits and vegetables. Try to fill half of your plate at each meal with fruits and vegetables. ? Eat whole grains, such as whole wheat pasta, brown rice, or whole grain bread. Fill about one quarter of your plate with whole grains. ? Eat low-fat diary products. ? Avoid fatty cuts of meat,  processed or cured meats, and poultry with skin. Fill about one quarter of your plate with lean proteins such as fish, chicken without skin, beans, eggs, and tofu. ? Avoid premade and processed foods. These tend to be higher in sodium, added sugar, and  fat.  Reduce your daily sodium intake. Most people with hypertension should eat less than 1,500 mg of sodium a day.  Limit alcohol intake to no more than 1 drink a day for nonpregnant women and 2 drinks a day for men. One drink equals 12 oz of beer, 5 oz of wine, or 1 oz of hard liquor. Lifestyle  Work with your health care provider to maintain a healthy body weight, or to lose weight. Ask what an ideal weight is for you.  Get at least 30 minutes of exercise that causes your heart to beat faster (aerobic exercise) most days of the week. Activities may include walking, swimming, or biking.  Include exercise to strengthen your muscles (resistance exercise), such as weight lifting, as part of your weekly exercise routine. Try to do these types of exercises for 30 minutes at least 3 days a week.  Do not use any products that contain nicotine or tobacco, such as cigarettes and e-cigarettes. If you need help quitting, ask your health care provider.  Control any long-term (chronic) conditions you have, such as high cholesterol or diabetes. Monitoring  Monitor your blood pressure at home as told by your health care provider. Your personal target blood pressure may vary depending on your medical conditions, your age, and other factors.  Have your blood pressure checked regularly, as often as told by your health care provider. Working with your health care provider  Review all the medicines you take with your health care provider because there may be side effects or interactions.  Talk with your health care provider about your diet, exercise habits, and other lifestyle factors that may be contributing to hypertension.  Visit your health care provider regularly. Your health care provider can help you create and adjust your plan for managing hypertension. Will I need medicine to control my blood pressure? Your health care provider may prescribe medicine if lifestyle changes are not enough to get  your blood pressure under control, and if:  Your systolic blood pressure is 130 or higher.  Your diastolic blood pressure is 80 or higher. Take medicines only as told by your health care provider. Follow the directions carefully. Blood pressure medicines must be taken as prescribed. The medicine does not work as well when you skip doses. Skipping doses also puts you at risk for problems. Contact a health care provider if:  You think you are having a reaction to medicines you have taken.  You have repeated (recurrent) headaches.  You feel dizzy.  You have swelling in your ankles.  You have trouble with your vision. Get help right away if:  You develop a severe headache or confusion.  You have unusual weakness or numbness, or you feel faint.  You have severe pain in your chest or abdomen.  You vomit repeatedly.  You have trouble breathing. Summary  Hypertension is when the force of blood pumping through your arteries is too strong. If this condition is not controlled, it may put you at risk for serious complications.  Your personal target blood pressure may vary depending on your medical conditions, your age, and other factors. For most people, a normal blood pressure is less than 120/80.  Hypertension is managed by lifestyle changes, medicines,  or both. Lifestyle changes include weight loss, eating a healthy, low-sodium diet, exercising more, and limiting alcohol. This information is not intended to replace advice given to you by your health care provider. Make sure you discuss any questions you have with your health care provider. Document Revised: 08/05/2018 Document Reviewed: 03/11/2016 Elsevier Patient Education  2020 [employer].

## 2019-09-23 ENCOUNTER — Other Ambulatory Visit: Payer: Self-pay

## 2019-09-23 ENCOUNTER — Ambulatory Visit (HOSPITAL_COMMUNITY)
Admission: EM | Admit: 2019-09-23 | Discharge: 2019-09-23 | Disposition: A | Discharge status: Home / Self Care | Payer: 59 | Attending: Internal Medicine | Admitting: Internal Medicine

## 2019-09-23 ENCOUNTER — Encounter (HOSPITAL_COMMUNITY): Payer: Self-pay | Admitting: *Deleted

## 2019-09-23 DIAGNOSIS — H9201 Otalgia, right ear: Secondary | ICD-10-CM | POA: Diagnosis not present

## 2019-09-23 DIAGNOSIS — J0300 Acute streptococcal tonsillitis, unspecified: Secondary | ICD-10-CM | POA: Insufficient documentation

## 2019-09-23 HISTORY — DX: Essential (primary) hypertension: I10

## 2019-09-23 LAB — POCT RAPID STREP A: Streptococcus, Group A Screen (Direct): NEGATIVE

## 2019-09-23 MED ORDER — PENICILLIN V POTASSIUM 500 MG PO TABS: 500.0000 mg | ORAL_TABLET | Freq: Three times a day (TID) | ORAL | 0 refills | Status: AC

## 2019-09-23 MED ORDER — ACETAMINOPHEN 500 MG PO TABS
500.0000 mg | ORAL_TABLET | Freq: Four times a day (QID) | ORAL | 0 refills | Status: DC | PRN
Start: 2019-09-23 — End: 2019-11-07

## 2019-09-23 NOTE — ED Provider Notes (Signed)
[facility]    CSN: [id number] Arrival date & time: 09/23/19  1259      History   Chief Complaint Chief Complaint  Patient presents with  . Otalgia    HPI Francis Coleman is a 59 y.o. male comes to the urgent care with a 2-day history of severe right ear pain.  Onset was fairly sudden.  Patient denies Q-tip insertion into his ears.  He also has a sore throat and difficulty swallowing.  No fever or chills.  No ringing in the ears.  No difficulty hearing out of the right ear.  No nausea or vomiting.  No trauma.  No upper respiratory infection symptoms .  HPI  Past Medical History:  Diagnosis Date  . GERD (gastroesophageal reflux disease)    past hx. -none recent-controls with diet.  . Hypertension   . S/P epidural steroid injection    due to fall and" herniated disc"  . Sleep apnea    no use since 2010    Patient Active Problem List   Diagnosis Date Noted  . Erectile dysfunction 09/14/2019    Past Surgical History:  Procedure Laterality Date  . CIRCUMCISION    . COLONOSCOPY WITH PROPOFOL N/A 09/03/2014   Procedure: COLONOSCOPY WITH PROPOFOL;  Surgeon: Charolett Bumpers, MD;  Location: [facility];  Service: Endoscopy;  Laterality: N/A;       Home Medications    Prior to Admission medications   Medication Sig Start Date End Date Taking? Authorizing Provider  VITAMIN D PO Take by mouth.   Yes [provider]  acetaminophen (TYLENOL) 500 MG tablet Take 1 tablet (500 mg total) by mouth every 6 (six) hours as needed. 09/23/19   Mersadez Linden, Britta Mccreedy, MD  atorvastatin (LIPITOR) 10 MG tablet Take 1 tablet (10 mg total) by mouth daily. 09/18/19   Mayers, Cari S, PA-C  cyclobenzaprine (FLEXERIL) 10 MG tablet Take 5-10 mg by mouth 3 (three) times daily as needed for muscle spasms.    [provider]  lisinopril-hydrochlorothiazide (ZESTORETIC) 10-12.5 MG tablet Take 1 tablet by mouth daily. 09/21/19   Mayers, Cari S, PA-C  penicillin v potassium  (VEETID) 500 MG tablet Take 1 tablet (500 mg total) by mouth 3 (three) times daily for 10 days. 09/23/19 10/03/19  Merrilee Jansky, MD  sildenafil (VIAGRA) 50 MG tablet Take 1 tablet (50 mg total) by mouth daily as needed for up to 10 doses for erectile dysfunction. 09/14/19   Mayers, Kasandra Knudsen, PA-C    Family History History reviewed. No pertinent family history.  Social History Social History   Tobacco Use  . Smoking status: Former Smoker    Packs/day: 0.25    Years: 2.00    Pack years: 0.50  . Smokeless tobacco: Former Neurosurgeon    Quit date: 08/23/1997  Substance Use Topics  . Alcohol use: Yes    Comment: socially  . Drug use: No     Allergies   Patient has no known allergies.   Review of Systems Review of Systems  Constitutional: Negative for activity change, chills and fever.  HENT: Positive for ear pain, sore throat and trouble swallowing. Negative for ear discharge.   Eyes: Negative.   Respiratory: Negative.   Cardiovascular: Negative.   Gastrointestinal: Negative.      Physical Exam Triage Vital Signs ED Triage Vitals  Enc Vitals Group     BP 09/23/19 1355 (!) 163/97     Pulse Rate 09/23/19 1355 90  Resp 09/23/19 1355 16     Temp 09/23/19 1355 99.5 F (37.5 C)     Temp Source 09/23/19 1355 Oral     SpO2 09/23/19 1355 100 %     Weight --      Height --      Head Circumference --      Peak Flow --      Pain Score 09/23/19 1356 10     Pain Loc --      Pain Edu? --      Excl. in [place]? --    No data found.  Updated Vital Signs BP (!) 163/97 Comment: was given new Rx for HTN med 2 days ago, hasn't started yet  Pulse 90   Temp 99.5 F (37.5 C) (Oral)   Resp 16   SpO2 100%   Visual Acuity Right Eye Distance:   Left Eye Distance:   Bilateral Distance:    Right Eye Near:   Left Eye Near:    Bilateral Near:     Physical Exam Vitals and nursing note reviewed.  Constitutional:      General: He is not in acute distress.    Appearance: He is not  ill-appearing.  HENT:     Right Ear: Tympanic membrane normal. There is no impacted cerumen.     Left Ear: Tympanic membrane normal. There is no impacted cerumen.     Mouth/Throat:     Mouth: Mucous membranes are moist.     Pharynx: Oropharyngeal exudate present.     Comments: White patches over both tonsils. Cardiovascular:     Rate and Rhythm: Normal rate and regular rhythm.     Pulses: Normal pulses.     Heart sounds: Normal heart sounds.  Pulmonary:     Effort: Pulmonary effort is normal.     Breath sounds: Normal breath sounds.  Musculoskeletal:     Cervical back: Normal range of motion.  Lymphadenopathy:     Cervical: Cervical adenopathy present.  Neurological:     Mental Status: He is alert.      UC Treatments / Results  Labs (all labs ordered are listed, but only abnormal results are displayed) Labs Reviewed  CULTURE, GROUP A STREP [facility])  POCT RAPID STREP A    EKG   Radiology No results found.  Procedures Procedures (including critical care time)  Medications Ordered in UC Medications - No data to display  Initial Impression / Assessment and Plan / UC Course  I have reviewed the triage vital signs and the nursing notes.  Pertinent labs & imaging results that were available during my care of the patient were reviewed by me and considered in my medical decision making (see chart for details).     1.  Acute tonsillitis likely secondary to strep: Rapid strep was negative Throat culture sent Penicillin V 500 mg 3 times daily for 10 days Tylenol for fever and chills Return precautions given Final Clinical Impressions(s) / UC Diagnoses   Final diagnoses:  Acute non-recurrent streptococcal tonsillitis   Discharge Instructions   None    ED Prescriptions    Medication Sig Dispense Auth. Provider   penicillin v potassium (VEETID) 500 MG tablet Take 1 tablet (500 mg total) by mouth 3 (three) times daily for 10 days. 30 tablet Jordon Bourquin, Myrene Galas, MD    acetaminophen (TYLENOL) 500 MG tablet Take 1 tablet (500 mg total) by mouth every 6 (six) hours as needed. 30 tablet Shalon Salado, Myrene Galas, MD  PDMP not reviewed this encounter.   Merrilee Jansky, MD 09/24/19 1016

## 2019-09-23 NOTE — ED Triage Notes (Signed)
C/O right ear pain x 2 days; states saw PCP that day, was told to take OTC allergy meds, but states not helping.

## 2019-09-25 ENCOUNTER — Other Ambulatory Visit: Payer: Self-pay

## 2019-09-25 ENCOUNTER — Encounter (HOSPITAL_COMMUNITY): Payer: Self-pay

## 2019-09-25 ENCOUNTER — Emergency Department (HOSPITAL_COMMUNITY)
Admission: EM | Admit: 2019-09-25 | Discharge: 2019-09-25 | Disposition: A | Discharge status: Home / Self Care | Payer: 59 | Attending: Emergency Medicine | Admitting: Emergency Medicine

## 2019-09-25 DIAGNOSIS — R070 Pain in throat: Secondary | ICD-10-CM

## 2019-09-25 DIAGNOSIS — J36 Peritonsillar abscess: Secondary | ICD-10-CM

## 2019-09-25 DIAGNOSIS — R1312 Dysphagia, oropharyngeal phase: Secondary | ICD-10-CM | POA: Diagnosis not present

## 2019-09-25 DIAGNOSIS — Z79899 Other long term (current) drug therapy: Secondary | ICD-10-CM | POA: Diagnosis not present

## 2019-09-25 DIAGNOSIS — Z87891 Personal history of nicotine dependence: Secondary | ICD-10-CM | POA: Insufficient documentation

## 2019-09-25 DIAGNOSIS — Z20822 Contact with and (suspected) exposure to covid-19: Secondary | ICD-10-CM | POA: Insufficient documentation

## 2019-09-25 DIAGNOSIS — J029 Acute pharyngitis, unspecified: Secondary | ICD-10-CM | POA: Diagnosis present

## 2019-09-25 LAB — CBC
HCT: 45.6 % (ref 39.0–52.0)
Hemoglobin: 14.8 g/dL (ref 13.0–17.0)
MCH: 27.4 pg (ref 26.0–34.0)
MCHC: 32.5 g/dL (ref 30.0–36.0)
MCV: 84.3 fL (ref 80.0–100.0)
Platelets: 301 10*3/uL (ref 150–400)
RBC: 5.41 MIL/uL (ref 4.22–5.81)
RDW: 14.9 % (ref 11.5–15.5)
WBC: 14.9 10*3/uL — ABNORMAL HIGH (ref 4.0–10.5)
nRBC: 0 % (ref 0.0–0.2)

## 2019-09-25 LAB — BASIC METABOLIC PANEL
Anion gap: 11 (ref 5–15)
BUN: 13 mg/dL (ref 6–20)
CO2: 22 mmol/L (ref 22–32)
Calcium: 9.9 mg/dL (ref 8.9–10.3)
Chloride: 105 mmol/L (ref 98–111)
Creatinine, Ser: 1.04 mg/dL (ref 0.61–1.24)
GFR calc Af Amer: >60 mL/min (ref >60)
GFR calc non Af Amer: >60 mL/min (ref >60)
Glucose, Bld: 99 mg/dL (ref 70–99)
Potassium: 3.7 mmol/L (ref 3.5–5.1)
Sodium: 138 mmol/L (ref 135–145)

## 2019-09-25 LAB — SARS CORONAVIRUS 2 BY RT PCR (HOSPITAL ORDER, PERFORMED IN ~~LOC~~ HOSPITAL LAB): SARS Coronavirus 2: NEGATIVE

## 2019-09-25 MED ORDER — PREDNISONE 10 MG PO TABS
40.0000 mg | ORAL_TABLET | Freq: Every day | ORAL | 0 refills | Status: AC
Start: 2019-09-25 — End: 2019-09-30

## 2019-09-25 MED ORDER — ONDANSETRON 8 MG PO TBDP
8.0000 mg | ORAL_TABLET | Freq: Three times a day (TID) | ORAL | 0 refills | Status: DC | PRN
Start: 2019-09-25 — End: 2019-11-07

## 2019-09-25 MED ORDER — CLINDAMYCIN HCL 150 MG PO CAPS
150.0000 mg | ORAL_CAPSULE | Freq: Four times a day (QID) | ORAL | 0 refills | Status: DC
Start: 2019-09-25 — End: 2019-11-07

## 2019-09-25 MED ORDER — SODIUM CHLORIDE 0.9 % IV SOLN
2.0000 g | Freq: Once | INTRAVENOUS | Status: AC
  Administered 2019-09-25: 2 g via INTRAVENOUS
  Filled 2019-09-25: qty 20

## 2019-09-25 MED ORDER — SODIUM CHLORIDE 0.9 % IV BOLUS
1000.0000 mL | Freq: Once | INTRAVENOUS | Status: AC
  Administered 2019-09-25: 1000 mL via INTRAVENOUS

## 2019-09-25 MED ORDER — FENTANYL CITRATE (PF) 100 MCG/2ML IJ SOLN
50.0000 ug | Freq: Once | INTRAMUSCULAR | Status: AC
  Administered 2019-09-25: 50 ug via INTRAVENOUS

## 2019-09-25 MED ORDER — FENTANYL CITRATE (PF) 100 MCG/2ML IJ SOLN
INTRAMUSCULAR | Status: AC
  Filled 2019-09-25: qty 2

## 2019-09-25 MED ORDER — METHYLPREDNISOLONE SODIUM SUCC 125 MG IJ SOLR
125.0000 mg | Freq: Once | INTRAMUSCULAR | Status: AC
  Administered 2019-09-25: 125 mg via INTRAVENOUS
  Filled 2019-09-25: qty 2

## 2019-09-25 MED ORDER — FENTANYL CITRATE (PF) 100 MCG/2ML IJ SOLN
50.0000 ug | Freq: Once | INTRAMUSCULAR | Status: AC
  Administered 2019-09-25: 50 ug via INTRAVENOUS
  Filled 2019-09-25: qty 2

## 2019-09-25 MED ORDER — OXYCODONE-ACETAMINOPHEN 5-325 MG PO TABS: 1.0000 | ORAL_TABLET | ORAL | 0 refills | Status: DC | PRN

## 2019-09-25 NOTE — ED Provider Notes (Addendum)
[facility] EMERGENCY DEPARTMENT Provider Note   CSN: [id number] Arrival date & time: 09/25/19  1002     History No chief complaint on file.   Francis Coleman is a 59 y.o. male.  HPI     59 year old male presents today complaining of sore throat that began 5 days ago.  He states he was seen at his primary care office that day.  He was then seen in urgent care on Saturday.  He has been on penicillin.  He is having ongoing right ear pain and worsening sore throat.  He is unable to swallow and has not been taking anything by mouth.  He denies nausea, vomiting, fever, or chills.  He has not had similar symptoms in the past.  He is not on any anticoagulants.  Past Medical History:  Diagnosis Date  . GERD (gastroesophageal reflux disease)    past hx. -none recent-controls with diet.  . Hypertension   . S/P epidural steroid injection    due to fall and" herniated disc"  . Sleep apnea    no use since 2010    Patient Active Problem List   Diagnosis Date Noted  . Erectile dysfunction 09/14/2019    Past Surgical History:  Procedure Laterality Date  . CIRCUMCISION    . COLONOSCOPY WITH PROPOFOL N/A 09/03/2014   Procedure: COLONOSCOPY WITH PROPOFOL;  Surgeon: Charolett Bumpers, MD;  Location: [facility];  Service: Endoscopy;  Laterality: N/A;       History reviewed. No pertinent family history.  Social History   Tobacco Use  . Smoking status: Former Smoker    Packs/day: 0.25    Years: 2.00    Pack years: 0.50  . Smokeless tobacco: Former Neurosurgeon    Quit date: 08/23/1997  Substance Use Topics  . Alcohol use: Yes    Comment: socially  . Drug use: No    Home Medications Prior to Admission medications   Medication Sig Start Date End Date Taking? Authorizing Provider  acetaminophen (TYLENOL) 500 MG tablet Take 1 tablet (500 mg total) by mouth every 6 (six) hours as needed. 09/23/19   Lamptey, Britta Mccreedy, MD  atorvastatin (LIPITOR) 10 MG tablet Take 1 tablet  (10 mg total) by mouth daily. 09/18/19   Mayers, Cari S, PA-C  cyclobenzaprine (FLEXERIL) 10 MG tablet Take 5-10 mg by mouth 3 (three) times daily as needed for muscle spasms.    [provider]  lisinopril-hydrochlorothiazide (ZESTORETIC) 10-12.5 MG tablet Take 1 tablet by mouth daily. 09/21/19   Mayers, Cari S, PA-C  penicillin v potassium (VEETID) 500 MG tablet Take 1 tablet (500 mg total) by mouth 3 (three) times daily for 10 days. 09/23/19 10/03/19  Merrilee Jansky, MD  sildenafil (VIAGRA) 50 MG tablet Take 1 tablet (50 mg total) by mouth daily as needed for up to 10 doses for erectile dysfunction. 09/14/19   Mayers, Cari S, PA-C  VITAMIN D PO Take by mouth.    [provider]    Allergies    Patient has no known allergies.  Review of Systems   Review of Systems  All other systems reviewed and are negative.   Physical Exam Updated Vital Signs BP (!) 157/97 (BP Location: Right Arm)   Pulse (!) 108   Temp 99.8 F (37.7 C) (Oral)   Resp 17   Ht 1.676 m (5\' 6" )   Wt 104.3 kg   SpO2 95%   BMI 37.12 kg/m   Physical Exam Vitals and  nursing note reviewed.  Constitutional:      Appearance: Normal appearance.  HENT:     Head: Normocephalic.     Right Ear: External ear normal.     Left Ear: External ear normal.     Nose: Nose normal.     Mouth/Throat:     Mouth: Mucous membranes are moist.     Comments: Right peritonsillar erythema and swelling Eyes:     Pupils: Pupils are equal, round, and reactive to light.  Cardiovascular:     Rate and Rhythm: Normal rate and regular rhythm.  Pulmonary:     Effort: Pulmonary effort is normal.     Breath sounds: Normal breath sounds.  Abdominal:     Palpations: Abdomen is soft.  Musculoskeletal:        General: Normal range of motion.     Cervical back: Normal range of motion.  Skin:    General: Skin is warm.     Capillary Refill: Capillary refill takes less than 2 seconds.  Neurological:     General: No focal  deficit present.     Mental Status: He is alert.  Psychiatric:        Mood and Affect: Mood normal.     ED Results / Procedures / Treatments   Labs (all labs ordered are listed, but only abnormal results are displayed) Labs Reviewed  SARS CORONAVIRUS 2 BY RT PCR (HOSPITAL ORDER, [place] [facility])  CBC  BASIC METABOLIC PANEL    EKG None  Radiology No results found.  Procedures Procedures (including critical care time)  Medications Ordered in ED Medications  sodium chloride 0.9 % bolus 1,000 mL (has no administration in time range)  cefTRIAXone (ROCEPHIN) 2 g in sodium chloride 0.9 % 100 mL IVPB (has no administration in time range)    ED Course  I have reviewed the triage vital signs and the nursing notes.  Pertinent labs & imaging results that were available during my care of the patient were reviewed by me and considered in my medical decision making (see chart for details).    MDM Rules/Calculators/A&P                       IV fluids and 2 g of Rocephin ordered. Discussed care with Dr. Marcelline Deist who will see patient in ED. Discussed with Dr. Marcelline Deist who has seen and drained peritonsillar abscess in the ED. Plan Solu-Medrol 125 mg Plan we will discontinue penicillin and begin clindamycin and continue outpatient steroids. Patient with IV fluids ordered.  Final Clinical Impression(s) / ED Diagnoses Final diagnoses:  Peritonsillar abscess    Rx / DC Orders ED Discharge Orders    None       Pattricia Boss, MD 09/26/19 1549    Pattricia Boss, MD 09/26/19 1550

## 2019-09-25 NOTE — Final Consult Note (Signed)
Consultant Note    Assessment/Final recommendations  Francis Coleman is a 59 y.o. male presenting to the ER with a 5 day history of right sided sore throat and ear pain. Treated with ABXs without improvement. Pain is worsening and is now severe. Associated symptoms include dysphagia and fever.  No bleeding disorders or immune issues.  A:  Right peritonsillar abscess      Pharyngitis      Dysphagia  After informed consent was obtained, the right palate was injected with lidocaine with epi.  An incision using a blade was made and large amounts of pus extruded.  The wound was enlarged with a curved hemostat.  Bleeding was self-limiting.  Patient tolerated well without complication.   Wound care (if applicable): Swish and spit three times daily with warm water and 1/2 strength H2O2 for the next 3 days.   Diet at discharge: as tolerated   Activity at discharge: regular   Follow-up appointment:  Follow up visit with PCP in 1 week as needed   Pending results:  Unresulted Labs (From admission, onward)    Start     Ordered   09/25/19 1101  SARS Coronavirus 2 by RT PCR (hospital order, performed in [facility] hospital lab) Nasopharyngeal Nasopharyngeal Swab  (Tier 2 (TAT 2 hrs))  Once,   STAT    Question Answer Comment  Is this test for diagnosis or screening Screening   Symptomatic for COVID-19 as defined by CDC No   Hospitalized for COVID-19 No   Admitted to ICU for COVID-19 No   Previously tested for COVID-19 No   Resident in a congregate (group) care setting No   Employed in healthcare setting No   Has patient completed COVID vaccination(s) (2 doses of Pfizer/Moderna 1 dose of [employer]) Unknown      09/25/19 1101           Medication recommendations:  Clindamycin 300 mg PO QID x 7 days   Other recommendations:    Thank you for allowing [place] to participate in the care of your patient!  Please consult [place] again if you have further needs for your patient.  Francis Coleman 09/25/2019 12:24 PM    Subjective   See above  Objective  Vital signs in last 24 hours: Temp:  [99.8 F (37.7 C)] 99.8 F (37.7 C) (05/31 1005) Pulse Rate:  [98-108] 98 (05/31 1130) Resp:  [17] 17 (05/31 1005) BP: (155-157)/(81-97) 155/81 (05/31 1130) SpO2:  [95 %-96 %] 96 % (05/31 1130) Weight:  [104.3 kg] 104.3 kg (05/31 1005)  General: AAOx3 PERRL/EOMI Pinna normal with clear canals Neck - no nodes or fluctuance Chest - sym expansions bilaterally without use of accessory muscles Cns intact, no focal motor deficits Trachea midline Skin warm and without rash OP/OC - FOM soft/uvula to the left/right soft palate swollen and red   Pertinent labs and Studies: Recent Labs    09/25/19 1101  WBC 14.9*  HGB 14.8  HCT 45.6   BMET Recent Labs    09/25/19 1101  NA 138  K 3.7  CL 105  CO2 22  GLUCOSE 99  BUN 13  CREATININE 1.04  CALCIUM 9.9   No results for input(s): LABURIN in the last 72 hours. Results for orders placed or performed during the hospital encounter of 09/23/19  Culture, group A strep (throat)     Status: None (Preliminary result)   Collection Time: 09/23/19  3:36 PM   Specimen: Throat  Result  Value Ref Range Status   Specimen Description THROAT  Final   Special Requests NONE  Final   Culture   Final    CULTURE REINCUBATED FOR BETTER GROWTH Performed at [facility], [address] [place], [place] [address]    Report Status PENDING  Incomplete    Imaging: No results found.

## 2019-09-25 NOTE — Discharge Instructions (Signed)
Return to the emergency department if you have bleeding or continued difficulty swallowing Follow-up with your primary care doctor in 1 week

## 2019-09-25 NOTE — ED Triage Notes (Signed)
Patient seen at urgent care and diagnosed with tonsillitis. States that he has sore throat and right ear pain and pain severe when he tries to eat and drink.

## 2019-09-26 LAB — CULTURE, GROUP A STREP (THRC)

## 2019-10-04 ENCOUNTER — Ambulatory Visit: Payer: 59

## 2019-11-08 ENCOUNTER — Telehealth: Payer: Self-pay

## 2019-11-08 NOTE — Telephone Encounter (Signed)
Called patient to do their pre-visit COVID screening.  Call went to voicemail. Unable to do prescreening.  

## 2019-11-08 NOTE — Patient Instructions (Signed)
Thank you for choosing Primary Care at [facility] to be your medical home!    [employer] was seen by De Hollingshead, DO today.   Anselm Pancoast Isola's primary care provider is Marcy Siren, DO.   For the best care possible, you should try to see Marcy Siren, DO whenever you come to the clinic.   We look forward to seeing you again soon!  If you have any questions about your visit today, please call us at [phone] or feel free to reach your primary care provider via MyChart.

## 2019-11-09 ENCOUNTER — Ambulatory Visit (INDEPENDENT_AMBULATORY_CARE_PROVIDER_SITE_OTHER): Payer: 59 | Admitting: Internal Medicine

## 2019-11-09 ENCOUNTER — Other Ambulatory Visit: Payer: Self-pay

## 2019-11-09 ENCOUNTER — Encounter: Payer: Self-pay | Admitting: Internal Medicine

## 2019-11-09 VITALS — BP 138/90 | HR 77 | Temp 97.2°F | Resp 17 | Ht 64.0 in | Wt 193.0 lb

## 2019-11-09 DIAGNOSIS — R5383 Other fatigue: Secondary | ICD-10-CM

## 2019-11-09 DIAGNOSIS — N529 Male erectile dysfunction, unspecified: Secondary | ICD-10-CM

## 2019-11-09 DIAGNOSIS — Z7689 Persons encountering health services in other specified circumstances: Secondary | ICD-10-CM

## 2019-11-09 DIAGNOSIS — E785 Hyperlipidemia, unspecified: Secondary | ICD-10-CM | POA: Diagnosis not present

## 2019-11-09 DIAGNOSIS — E669 Obesity, unspecified: Secondary | ICD-10-CM | POA: Diagnosis not present

## 2019-11-09 DIAGNOSIS — I10 Essential (primary) hypertension: Secondary | ICD-10-CM

## 2019-11-09 DIAGNOSIS — Z114 Encounter for screening for human immunodeficiency virus [HIV]: Secondary | ICD-10-CM

## 2019-11-09 DIAGNOSIS — R0681 Apnea, not elsewhere classified: Secondary | ICD-10-CM

## 2019-11-09 MED ORDER — SILDENAFIL CITRATE 50 MG PO TABS: 50.0000 mg | ORAL_TABLET | Freq: Every day | ORAL | 0 refills | Status: DC | PRN

## 2019-11-09 NOTE — Progress Notes (Signed)
Subjective:    Francis Coleman - 59 y.o. male MRN [id number]  Date of birth: [birth year]  HPI  Francis Coleman is to establish care. Patient has a PMH significant for HTN, HLD, ED, and obesity.    Chronic HTN Disease Monitoring:  Home BP Monitoring - 110-130s/70s  Chest pain- no  Dyspnea- no Headache - no  Medications: Lisinopril 10 mg, HCTZ 12.5 mg  Compliance- yes Lightheadedness- no  Edema- no    ROS per HPI   Health Maintenance:  Health Maintenance Due  Topic Date Due   COVID-19 Vaccine (1) Never done   HIV Screening  Never done   TETANUS/TDAP  Never done     Past Medical History: Patient Active Problem List   Diagnosis Date Noted   Essential hypertension 11/09/2019   Hyperlipidemia 11/09/2019   Erectile dysfunction 09/14/2019      Social History   reports that he has quit smoking. He has a 0.50 pack-year smoking history. He quit smokeless tobacco use about 22 years ago. He reports current alcohol use. He reports that he does not use drugs.   Family History  family history is not on file.   Medications: reviewed and updated   Objective:   Physical Exam BP 138/90    Pulse 77    Temp (!) 97.2 F (36.2 C) (Temporal)    Resp 17    Ht 5\' 4"  (1.626 m)    Wt 193 lb (87.5 kg)    SpO2 98%    BMI 33.13 kg/m  Physical Exam Constitutional:      General: He is not in acute distress.    Appearance: He is not diaphoretic.  HENT:     Head: Normocephalic and atraumatic.  Eyes:     Conjunctiva/sclera: Conjunctivae normal.  Cardiovascular:     Rate and Rhythm: Normal rate and regular rhythm.     Heart sounds: Normal heart sounds. No murmur heard.   Pulmonary:     Effort: Pulmonary effort is normal. No respiratory distress.     Breath sounds: Normal breath sounds.  Musculoskeletal:        General: Normal range of motion.  Skin:    General: Skin is warm and dry.  Neurological:     Mental Status: He is alert and oriented to person,  place, and time.  Psychiatric:        Mood and Affect: Affect normal.        Judgment: Judgment normal.         Assessment & Plan:   1. Encounter to establish care Reviewed patient's PMH, social history, surgical history, and medications.   2. Essential hypertension BP is close to goal, did not take medication this morning. Well controlled at home. Continue current regimen.   3. Hyperlipidemia, unspecified hyperlipidemia type Based on LDL and ASCVD risk score, was started on Lipitor recently. Will plan to repeat LDL at next visit.   4. Class 1 obesity with serious comorbidity in adult, unspecified BMI, unspecified obesity type Patient has cut out red meats, salt, and fried foods from diet. Seems to have lost some weight but unsure if the 230 or 200 lb weight noted in May is accurate. Continue to monitor.   5. Screening for HIV (human immunodeficiency virus) - HIV antibody (with reflex)  6. Erectile dysfunction, unspecified erectile dysfunction type Not taking any nitrates.  - sildenafil (VIAGRA) 50 MG tablet; Take 1 tablet (50 mg total) by mouth daily as needed for up to 10  doses for erectile dysfunction.  Dispense: 10 tablet; Refill: 0  7. Other fatigue 8. Apnea Patient endorsing fatigue and reports while wife was alive she would wake him up when he stopped breathing in his sleep. Warrants sleep study to investigate further. Discussed undiagnosed/untreated OSA can cause fatigue. Noted to have mild Vit D deficiency, taking supplements. Normal recent BMET, CBC, and TSH.  - Split night study; Future     Francis Coleman, D.O. 11/09/2019, 10:24 AM Primary Care at [facility]

## 2019-11-10 LAB — HIV ANTIBODY (ROUTINE TESTING W REFLEX): HIV Screen 4th Generation wRfx: NONREACTIVE

## 2019-11-24 NOTE — Progress Notes (Signed)
Normal lab letter mailed.

## 2020-01-11 ENCOUNTER — Ambulatory Visit (HOSPITAL_BASED_OUTPATIENT_CLINIC_OR_DEPARTMENT_OTHER): Payer: 59 | Attending: Internal Medicine | Admitting: Internal Medicine

## 2020-01-11 ENCOUNTER — Other Ambulatory Visit: Payer: Self-pay

## 2020-01-11 VITALS — Ht 66.0 in | Wt 189.0 lb

## 2020-01-11 DIAGNOSIS — R0683 Snoring: Secondary | ICD-10-CM | POA: Insufficient documentation

## 2020-01-11 DIAGNOSIS — R5383 Other fatigue: Secondary | ICD-10-CM | POA: Diagnosis present

## 2020-01-11 DIAGNOSIS — G4733 Obstructive sleep apnea (adult) (pediatric): Secondary | ICD-10-CM | POA: Diagnosis not present

## 2020-01-11 DIAGNOSIS — R0681 Apnea, not elsewhere classified: Secondary | ICD-10-CM

## 2020-01-20 DIAGNOSIS — R5383 Other fatigue: Secondary | ICD-10-CM

## 2020-01-20 NOTE — Procedures (Signed)
Patient Name: Francis Coleman, Francis Coleman Date: 01/11/2020 Gender: Male D.O.B: 1960-08-22 Age (years): 59 Referring Provider: Melina Schools DO Height (inches): 48 Interpreting Physician: Baird Lyons MD, ABSM Weight (lbs): 189 RPSGT: Lanae Boast BMI: 31 MRN: [id number] Neck Size: 16.50  CLINICAL INFORMATION Sleep Study Type: Split Night CPAP Indication for sleep study: Fatigue, Snoring, Witnessed Apneas Epworth Sleepiness Score: 17  SLEEP STUDY TECHNIQUE As per the AASM Manual for the Scoring of Sleep and Associated Events v2.3 (April 2016) with a hypopnea requiring 4% desaturations.  The channels recorded and monitored were frontal, central and occipital EEG, electrooculogram (EOG), submentalis EMG (chin), nasal and oral airflow, thoracic and abdominal wall motion, anterior tibialis EMG, snore microphone, electrocardiogram, and pulse oximetry. Continuous positive airway pressure (CPAP) was initiated when the patient met split night criteria and was titrated according to treat sleep-disordered breathing.  MEDICATIONS Medications self-administered by patient taken the night of the study : non reported  RESPIRATORY PARAMETERS Diagnostic  Total AHI (/hr): 19.6 RDI (/hr): 21.3 OA Index (/hr): 7.2 CA Index (/hr): 8.3 REM AHI (/hr): 63.7 NREM AHI (/hr): 6.3 Supine AHI (/hr): 15.2 Non-supine AHI (/hr): 24.3 Min O2 Sat (%): 89.0 Mean O2 (%): 96.3 Time below 88% (min): 0   Titration  Optimal Pressure (cm):  AHI at Optimal Pressure (/hr): N/A Min O2 at Optimal Pressure (%): 91.0 Supine % at Optimal (%): N/A Sleep % at Optimal (%): N/A   SLEEP ARCHITECTURE The recording time for the entire night was 376.4 minutes.  During a baseline period of 186.4 minutes, the patient slept for 174.3 minutes in REM and nonREM, yielding a sleep efficiency of 93.5%%. Sleep onset after lights out was 1.1 minutes with a REM latency of 50.5 minutes. The patient spent 6.6%% of the night in  stage N1 sleep, 70.2%% in stage N2 sleep, 0.0%% in stage N3 and 23.2% in REM.  During the titration period of 184.7 minutes, the patient slept for 81.0 minutes in REM and nonREM, yielding a sleep efficiency of 43.9%%. Sleep onset after CPAP initiation was 0.9 minutes with a REM latency of 60.5 minutes. The patient spent 2.5%% of the night in stage N1 sleep, 67.3%% in stage N2 sleep, 0.0%% in stage N3 and 30.2% in REM.  CARDIAC DATA The 2 lead EKG demonstrated sinus rhythm. The mean heart rate was 100.0 beats per minute. Other EKG findings include: None.  LEG MOVEMENT DATA The total Periodic Limb Movements of Sleep (PLMS) were 0. The PLMS index was 0.0 .  IMPRESSIONS - Moderate obstructive sleep apnea occurred during the diagnostic portion of the study(AHI = 19.6/hour).  - CPAP was titrated to 8 cwp which controlled obstructive apneas. There were a few central apneas. - The patient woke at 3:45 AM, curtailing titration time, and could not regain sleep. He reports an irregular sleep scchedule due to his job. - Mild central sleep apnea occurred during the diagnostic portion of the study (CAI = 8.3/hour). - The patient had minimal or no oxygen desaturation during the diagnostic portion of the study (Min O2 = 89.0%). Minimum O2 sat on CPAP 8 was 91%. - The patient snored with moderate snoring volume during the diagnostic portion of the study. - No cardiac abnormalities were noted during this study. - Clinically significant periodic limb movements did not occur during sleep.  DIAGNOSIS - Obstructive Sleep Apnea (G47.33)  RECOMMENDATIONS - Sugest trial with CPAP 8 or autopap 5-15. He wore a medium ResMed AirFit F20 mask with humidification.  - Evaluate  ways to accomodate job-related irregular sleep schedule. - Sleep hygiene should be reviewed to assess factors that may improve sleep quality. - Weight management and regular exercise should be initiated or continued.  [Electronically signed]  01/20/2020 12:05 PM  Baird Lyons MD, [place], American Board of Sleep Medicine   NPI: [id number]                         [place] [place] [facility]  ELECTRONICALLY SIGNED ON:  01/20/2020, 11:56 AM [place] PH: [phone]   FX: [phone] [place]

## 2020-01-20 NOTE — Progress Notes (Signed)
Patient with moderate obstructive sleep apnea and will need CPAP machine and supplies. Please make Dr. Earlene Plater aware so that she can contact the patient

## 2020-01-25 ENCOUNTER — Other Ambulatory Visit: Payer: Self-pay | Admitting: Internal Medicine

## 2020-01-25 DIAGNOSIS — G4733 Obstructive sleep apnea (adult) (pediatric): Secondary | ICD-10-CM | POA: Insufficient documentation

## 2020-02-12 ENCOUNTER — Other Ambulatory Visit: Payer: Self-pay

## 2020-02-12 ENCOUNTER — Telehealth (INDEPENDENT_AMBULATORY_CARE_PROVIDER_SITE_OTHER): Payer: 59 | Admitting: Internal Medicine

## 2020-02-12 DIAGNOSIS — I10 Essential (primary) hypertension: Secondary | ICD-10-CM

## 2020-02-12 DIAGNOSIS — R058 Other specified cough: Secondary | ICD-10-CM | POA: Diagnosis not present

## 2020-02-12 DIAGNOSIS — E785 Hyperlipidemia, unspecified: Secondary | ICD-10-CM

## 2020-02-12 DIAGNOSIS — E7841 Elevated Lipoprotein(a): Secondary | ICD-10-CM

## 2020-02-12 MED ORDER — LISINOPRIL-HYDROCHLOROTHIAZIDE 10-12.5 MG PO TABS: 1.0000 | ORAL_TABLET | Freq: Every day | ORAL | 1 refills | Status: DC

## 2020-02-12 MED ORDER — ATORVASTATIN CALCIUM 10 MG PO TABS: 10.0000 mg | ORAL_TABLET | Freq: Every day | ORAL | 1 refills | Status: DC

## 2020-02-12 NOTE — Progress Notes (Signed)
Virtual Visit via Telephone Note  I connected with Francis Coleman, on 02/12/2020 at 3:24 PM by telephone due to the COVID-19 pandemic and verified that I am speaking with the correct person using two identifiers.   Consent: I discussed the limitations, risks, security and privacy concerns of performing an evaluation and management service by telephone and the availability of in person appointments. I also discussed with the patient that there may be a patient responsible charge related to this service. The patient expressed understanding and agreed to proceed.   Location of Patient: Home   Location of Provider: Clinic    Persons participating in Telemedicine visit: Francis Coleman [facility] Dr. Earlene Plater      History of Present Illness: Patient has a visit to f/u on chronic medical conditions. Has been working to improve on diet. Lessening fried foods.   Also endorsing productive cough. This has been occurring for several months. No hemoptysis. Productive of some phlegm. No other nasal congestion noted. Afebrile. Former smoker.   Chronic HTN Disease Monitoring:  Home BP Monitoring - 120-130s/70-80s--reports used to be in 160s systolics in past and now never sees numbers that high  Chest pain- no  Dyspnea- no Headache - no  Medications: Lisinopril 10 mg, HCTZ 12.5 mg  Compliance- yes Lightheadedness- no  Edema- no   HYPERLIPIDEMIA  Symptoms Chest pain on exertion:  no    Leg claudication:   no  Medication Monitoring Compliance- Yes with Lipitor 10 mg   Right upper quadrant pain- no    Muscle aches- no      Past Medical History:  Diagnosis Date  . GERD (gastroesophageal reflux disease)    past hx. -none recent-controls with diet.  . Hypertension   . S/P epidural steroid injection    due to fall and" herniated disc"  . Sleep apnea    no use since 2010   No Known Allergies  Current Outpatient Medications on File Prior to Visit   Medication Sig Dispense Refill  . atorvastatin (LIPITOR) 10 MG tablet Take 1 tablet (10 mg total) by mouth daily. 90 tablet 3  . lisinopril-hydrochlorothiazide (ZESTORETIC) 10-12.5 MG tablet Take 1 tablet by mouth daily. 90 tablet 3  . sildenafil (VIAGRA) 50 MG tablet Take 1 tablet (50 mg total) by mouth daily as needed for up to 10 doses for erectile dysfunction. 10 tablet 0   No current facility-administered medications on file prior to visit.    Observations/Objective: NAD. Speaking clearly.  Work of breathing normal.  Alert and oriented. Mood appropriate.   Assessment and Plan: 1. Essential hypertension BP at goal. Continue current regimen.  Counseled on blood pressure goal of less than 130/80, low-sodium, DASH diet, medication compliance, 150 minutes of moderate intensity exercise per week. Discussed medication compliance, adverse effects.  - Lipid Panel; Future - Comprehensive metabolic panel; Future - lisinopril-hydrochlorothiazide (ZESTORETIC) 10-12.5 MG tablet; Take 1 tablet by mouth daily.  Dispense: 90 tablet; Refill: 1  2. Hyperlipidemia, unspecified hyperlipidemia type - atorvastatin (LIPITOR) 10 MG tablet; Take 1 tablet (10 mg total) by mouth daily.  Dispense: 90 tablet; Refill: 1  3. Productive cough Will obtain CXR given chronicity and prior smoking history. If negative, consider silent acid reflux or postnasal drip as etiology. Lisinopril potentially offending agent but would anticipate dry cough.  - DG Chest 2 View; Future   Follow Up Instructions: Lab visit    I discussed the assessment and treatment plan with the patient. The patient was provided an opportunity  to ask questions and all were answered. The patient agreed with the plan and demonstrated an understanding of the instructions.   The patient was advised to call back or seek an in-person evaluation if the symptoms worsen or if the condition fails to improve as anticipated.     I provided 24  minutes total of non-face-to-face time during this encounter including median intraservice time, reviewing previous notes, investigations, ordering medications, medical decision making, coordinating care and patient verbalized understanding at the end of the visit.    Marcy Siren, D.O. Primary Care at [facility]  02/12/2020, 3:24 PM

## 2020-02-15 ENCOUNTER — Ambulatory Visit (INDEPENDENT_AMBULATORY_CARE_PROVIDER_SITE_OTHER): Payer: 59

## 2020-02-15 ENCOUNTER — Other Ambulatory Visit: Payer: Self-pay

## 2020-02-15 ENCOUNTER — Other Ambulatory Visit: Payer: 59

## 2020-02-15 DIAGNOSIS — R058 Other specified cough: Secondary | ICD-10-CM | POA: Diagnosis not present

## 2020-02-15 DIAGNOSIS — I10 Essential (primary) hypertension: Secondary | ICD-10-CM | POA: Diagnosis not present

## 2020-02-15 NOTE — Progress Notes (Signed)
Patient here for fasting labs & chest xray.

## 2020-02-16 ENCOUNTER — Other Ambulatory Visit: Payer: Self-pay | Admitting: Internal Medicine

## 2020-02-16 DIAGNOSIS — E785 Hyperlipidemia, unspecified: Secondary | ICD-10-CM

## 2020-02-16 LAB — COMPREHENSIVE METABOLIC PANEL
ALT: 18 IU/L (ref 0–44)
AST: 17 IU/L (ref 0–40)
Albumin/Globulin Ratio: 1.9 (ref 1.2–2.2)
Albumin: 4.5 g/dL (ref 3.8–4.9)
Alkaline Phosphatase: 53 IU/L (ref 44–121)
BUN/Creatinine Ratio: 15 (ref 9–20)
BUN: 15 mg/dL (ref 6–24)
Bilirubin Total: 0.3 mg/dL (ref 0.0–1.2)
CO2: 24 mmol/L (ref 20–29)
Calcium: 9.2 mg/dL (ref 8.7–10.2)
Chloride: 107 mmol/L — ABNORMAL HIGH (ref 96–106)
Creatinine, Ser: 0.99 mg/dL (ref 0.76–1.27)
GFR calc Af Amer: 96 mL/min/{1.73_m2} (ref >59)
GFR calc non Af Amer: 83 mL/min/{1.73_m2} (ref >59)
Globulin, Total: 2.4 g/dL (ref 1.5–4.5)
Glucose: 86 mg/dL (ref 65–99)
Potassium: 4.2 mmol/L (ref 3.5–5.2)
Sodium: 142 mmol/L (ref 134–144)
Total Protein: 6.9 g/dL (ref 6.0–8.5)

## 2020-02-16 LAB — LIPID PANEL
Chol/HDL Ratio: 3.5 ratio (ref 0.0–5.0)
Cholesterol, Total: 179 mg/dL (ref 100–199)
HDL: 51 mg/dL (ref >39)
LDL Chol Calc (NIH): 112 mg/dL — ABNORMAL HIGH (ref 0–99)
Triglycerides: 88 mg/dL (ref 0–149)
VLDL Cholesterol Cal: 16 mg/dL (ref 5–40)

## 2020-02-16 MED ORDER — ASPIRIN 81 MG PO TBEC: 81.0000 mg | DELAYED_RELEASE_TABLET | Freq: Every day | ORAL | 12 refills | Status: AC

## 2020-02-16 MED ORDER — ATORVASTATIN CALCIUM 40 MG PO TABS: 40.0000 mg | ORAL_TABLET | Freq: Every day | ORAL | 3 refills | Status: DC

## 2020-02-23 NOTE — Progress Notes (Signed)
Patient notified of results & recommendations. Expressed understanding.

## 2020-02-28 ENCOUNTER — Ambulatory Visit: Payer: 59 | Attending: Critical Care Medicine

## 2020-02-28 ENCOUNTER — Other Ambulatory Visit: Payer: Self-pay

## 2020-02-28 ENCOUNTER — Ambulatory Visit: Payer: 59 | Admitting: Physician Assistant

## 2020-02-28 VITALS — BP 133/82 | HR 91 | Temp 98.2°F | Resp 18 | Ht 67.0 in | Wt 204.0 lb

## 2020-02-28 DIAGNOSIS — M25561 Pain in right knee: Secondary | ICD-10-CM

## 2020-02-28 DIAGNOSIS — Z23 Encounter for immunization: Secondary | ICD-10-CM

## 2020-02-28 DIAGNOSIS — G8929 Other chronic pain: Secondary | ICD-10-CM | POA: Diagnosis not present

## 2020-02-28 MED ORDER — IBUPROFEN 800 MG PO TABS: 800.0000 mg | ORAL_TABLET | Freq: Three times a day (TID) | ORAL | 0 refills | Status: DC | PRN

## 2020-02-28 NOTE — Progress Notes (Signed)
   Covid-19 Vaccination Clinic  Name:  ALDO SONDGEROTH    MRN: [id number] DOB: 10-Aug-1960  02/28/2020  Mr. Pember was observed post Covid-19 immunization for 15 minutes without incident. He was provided with Vaccine Information Sheet and instruction to access the V-Safe system.   Mr. Brod was instructed to call 911 with any severe reactions post vaccine: Marland Kitchen Difficulty breathing  . Swelling of face and throat  . A fast heartbeat  . A bad rash all over body  . Dizziness and weakness   Immunizations Administered    Name Date Dose VIS Date Route   Moderna COVID-19 Vaccine 02/28/2020  4:40 PM 0.5 mL 02/14/2020 Intramuscular   Manufacturer: Moderna   Lot: [id number]   NDC: 84536-468-03

## 2020-02-28 NOTE — Patient Instructions (Signed)
I encourage you to wear a brace, ice your knee and use ibuprofen 800 mg every 8 hours as needed for pain.  Try and rest your knee as much as possible.  Please let us know if there is anything else we can do for you  Roney Jaffe, PA-C Physician Assistant [facility] [email or web address]   Acute Knee Pain, Adult Acute knee pain is sudden and may be caused by damage, swelling, or irritation of the muscles and tissues that support your knee. The injury may result from:  A fall.  An injury to your knee from twisting motions.  A hit to the knee.  Infection. Acute knee pain may go away on its own with time and rest. If it does not, your health care provider may order tests to find the cause of the pain. These may include:  Imaging tests, such as an X-ray, MRI, or ultrasound.  Joint aspiration. In this test, fluid is removed from the knee.  Arthroscopy. In this test, a lighted tube is inserted into the knee and an image is projected onto a TV screen.  Biopsy. In this test, a sample of tissue is removed from the body and studied under a microscope. Follow these instructions at home: Pay attention to any changes in your symptoms. Take these actions to relieve your pain. If you have a knee sleeve or brace:   Wear the sleeve or brace as told by your health care provider. Remove it only as told by your health care provider.  Loosen the sleeve or brace if your toes tingle, become numb, or turn cold and blue.  Keep the sleeve or brace clean.  If the sleeve or brace is not waterproof: ? Do not let it get wet. ? Cover it with a watertight covering when you take a bath or shower. Activity  Rest your knee.  Do not do things that cause pain or make pain worse.  Avoid high-impact activities or exercises, such as running, jumping rope, or doing jumping jacks.  Work with a physical therapist to make a safe exercise program, as  recommended by your health care provider. Do exercises as told by your physical therapist. Managing pain, stiffness, and swelling   If directed, put ice on the knee: ? Put ice in a plastic bag. ? Place a towel between your skin and the bag. ? Leave the ice on for 20 minutes, 2-3 times a day.  If directed, use an elastic bandage to put pressure (compression) on your injured knee. This may control swelling, give support, and help with discomfort. General instructions  Take over-the-counter and prescription medicines only as told by your health care provider.  Raise (elevate) your knee above the level of your heart when you are sitting or lying down.  Sleep with a pillow under your knee.  Do not use any products that contain nicotine or tobacco, such as cigarettes, e-cigarettes, and chewing tobacco. These can delay healing. If you need help quitting, ask your health care provider.  If you are overweight, work with your health care provider and a dietitian to set a weight-loss goal that is healthy and reasonable for you. Extra weight can put pressure on your knee.  Keep all follow-up visits as told by your health care provider. This is important. Contact a health care provider if:  Your knee pain continues, changes, or gets worse.  You have a fever along with knee pain.  Your knee feels warm to the touch.  Your knee buckles or locks up. Get help right away if:  Your knee swells, and the swelling becomes worse.  You cannot move your knee.  You have severe pain in your knee. Summary  Acute knee pain can be caused by a fall, an injury, an infection, or damage, swelling, or irritation of the tissues that support your knee.  Your health care provider may perform tests to find out the cause of the pain.  Pay attention to any changes in your symptoms. Relieve your pain with rest, medicines, light activity, and use of ice.  Get help if your pain continues or becomes worse, your  knee swells, or you cannot move your knee. This information is not intended to replace advice given to you by your health care provider. Make sure you discuss any questions you have with your health care provider. Document Revised: 09/23/2017 Document Reviewed: 09/23/2017 Elsevier Patient Education  2020 [employer].

## 2020-02-28 NOTE — Progress Notes (Signed)
Established Patient Office Visit  Subjective:  Patient ID: Francis Coleman, male    DOB: 1960-09-20  Age: 59 y.o. MRN: [id number]  CC:  Chief Complaint  Patient presents with  . Edema    lower extremeties     HPI [employer]   Knee Pain: Patient presents with knee swelling involving the  right knee. Onset of the symptoms was several days ago. Inciting event: none known / denies any recent trauma/injury. Current symptoms include pain and swelling. Pain is aggravated by any weight bearing, going up and down stairs, pivoting and walking.  Patient has had prior knee problems. Evaluation to date: none. Treatment to date: States that he had surgery on same knee for torn ligaments approx 20 years ago.  States that he used leftover norco this weekend with some relief.    Past Medical History:  Diagnosis Date  . GERD (gastroesophageal reflux disease)    past hx. -none recent-controls with diet.  . Hypertension   . S/P epidural steroid injection    due to fall and" herniated disc"  . Sleep apnea    no use since 2010    Past Surgical History:  Procedure Laterality Date  . CIRCUMCISION    . COLON SURGERY N/A    [facility]reesia 02/12/2020  . COLONOSCOPY WITH PROPOFOL N/A 09/03/2014   Procedure: COLONOSCOPY WITH PROPOFOL;  Surgeon: Charolett Bumpers, MD;  Location: [facility];  Service: Endoscopy;  Laterality: N/A;    History reviewed. No pertinent family history.  Social History   Socioeconomic History  . Marital status: Widowed    Spouse name: Not on file  . Number of children: Not on file  . Years of education: Not on file  . Highest education level: Not on file  Occupational History  . Not on file  Tobacco Use  . Smoking status: Former Smoker    Packs/day: 0.25    Years: 2.00    Pack years: 0.50  . Smokeless tobacco: Former Neurosurgeon    Quit date: 08/23/1997  Vaping Use  . Vaping Use: Never used  Substance and Sexual Activity  . Alcohol use: Yes    Comment: socially  .  Drug use: No  . Sexual activity: Not Currently  Other Topics Concern  . Not on file  Social History Narrative  . Not on file   Social Determinants of Health   Financial Resource Strain:   . Difficulty of Paying Living Expenses: Not on file  Food Insecurity:   . Worried About Programme researcher, broadcasting/film/video in the Last Year: Not on file  . Ran Out of Food in the Last Year: Not on file  Transportation Needs:   . Lack of Transportation (Medical): Not on file  . Lack of Transportation (Non-Medical): Not on file  Physical Activity:   . Days of Exercise per Week: Not on file  . Minutes of Exercise per Session: Not on file  Stress:   . Feeling of Stress : Not on file  Social Connections:   . Frequency of Communication with Friends and Family: Not on file  . Frequency of Social Gatherings with Friends and Family: Not on file  . Attends Religious Services: Not on file  . Active Member of Clubs or Organizations: Not on file  . Attends Banker Meetings: Not on file  . Marital Status: Not on file  Intimate Partner Violence:   . Fear of Current or Ex-Partner: Not on file  . Emotionally Abused: Not  on file  . Physically Abused: Not on file  . Sexually Abused: Not on file    Outpatient Medications Prior to Visit  Medication Sig Dispense Refill  . aspirin (EC-81 ASPIRIN) 81 MG EC tablet Take 1 tablet (81 mg total) by mouth daily. Swallow whole. 30 tablet 12  . atorvastatin (LIPITOR) 40 MG tablet Take 1 tablet (40 mg total) by mouth daily. 90 tablet 3  . lisinopril-hydrochlorothiazide (ZESTORETIC) 10-12.5 MG tablet Take 1 tablet by mouth daily. 90 tablet 1  . sildenafil (VIAGRA) 50 MG tablet Take 1 tablet (50 mg total) by mouth daily as needed for up to 10 doses for erectile dysfunction. 10 tablet 0   No facility-administered medications prior to visit.    No Known Allergies  ROS Review of Systems  Constitutional: Negative.   HENT: Negative.   Eyes: Negative.   Respiratory:  Negative.   Cardiovascular: Negative.   Gastrointestinal: Negative.   Endocrine: Negative.   Genitourinary: Negative.   Musculoskeletal: Positive for gait problem and joint swelling.  Skin: Negative.   Allergic/Immunologic: Negative.   Hematological: Negative.   Psychiatric/Behavioral: Negative.       Objective:    Physical Exam Vitals and nursing note reviewed.  Constitutional:      General: He is not in acute distress.    Appearance: Normal appearance. He is not ill-appearing.  HENT:     Head: Normocephalic and atraumatic.     Right Ear: External ear normal.     Left Ear: External ear normal.     Nose: Nose normal.     Mouth/Throat:     Mouth: Mucous membranes are moist.     Pharynx: Oropharynx is clear.  Eyes:     Conjunctiva/sclera: Conjunctivae normal.     Pupils: Pupils are equal, round, and reactive to light.  Cardiovascular:     Rate and Rhythm: Normal rate and regular rhythm.     Pulses: Normal pulses.     Heart sounds: Normal heart sounds.  Pulmonary:     Effort: Pulmonary effort is normal.     Breath sounds: Normal breath sounds.  Abdominal:     General: Abdomen is flat.     Palpations: Abdomen is soft.  Musculoskeletal:     Cervical back: Normal range of motion and neck supple.     Right knee: Swelling present. Decreased range of motion. Tenderness present.     Left knee: Normal.  Skin:    General: Skin is warm and dry.  Neurological:     General: No focal deficit present.     Mental Status: He is alert and oriented to person, place, and time.  Psychiatric:        Mood and Affect: Mood normal.        Behavior: Behavior normal.        Thought Content: Thought content normal.        Judgment: Judgment normal.     BP 133/82 (BP Location: Left Arm, Patient Position: Sitting, Cuff Size: Normal)   Pulse 91   Temp 98.2 F (36.8 C) (Oral)   Resp 18   Ht 5\' 7"  (1.702 m)   Wt 204 lb (92.5 kg)   SpO2 97%   BMI 31.95 kg/m  Wt Readings from Last 3  Encounters:  02/28/20 204 lb (92.5 kg)  01/11/20 189 lb (85.7 kg)  11/09/19 193 lb (87.5 kg)     Health Maintenance Due  Topic Date Due  . TETANUS/TDAP  Never done  .  INFLUENZA VACCINE  Never done    There are no preventive care reminders to display for this patient.  Lab Results  Component Value Date   TSH 1.350 09/14/2019   Lab Results  Component Value Date   WBC 14.9 (H) 09/25/2019   HGB 14.8 09/25/2019   HCT 45.6 09/25/2019   MCV 84.3 09/25/2019   PLT 301 09/25/2019   Lab Results  Component Value Date   NA 142 02/15/2020   K 4.2 02/15/2020   CO2 24 02/15/2020   GLUCOSE 86 02/15/2020   BUN 15 02/15/2020   CREATININE 0.99 02/15/2020   BILITOT 0.3 02/15/2020   ALKPHOS 53 02/15/2020   AST 17 02/15/2020   ALT 18 02/15/2020   PROT 6.9 02/15/2020   ALBUMIN 4.5 02/15/2020   CALCIUM 9.2 02/15/2020   ANIONGAP 11 09/25/2019   Lab Results  Component Value Date   CHOL 179 02/15/2020   Lab Results  Component Value Date   HDL 51 02/15/2020   Lab Results  Component Value Date   LDLCALC 112 (H) 02/15/2020   Lab Results  Component Value Date   TRIG 88 02/15/2020   Lab Results  Component Value Date   CHOLHDL 3.5 02/15/2020   No results found for: HGBA1C    Assessment & Plan:   Problem List Items Addressed This Visit    None    Visit Diagnoses    Chronic pain of right knee    -  Primary   Relevant Medications   ibuprofen (ADVIL) 800 MG tablet   Other Relevant Orders   DG Knee Complete 4 Views Right   Ambulatory referral to Orthopedic Surgery    1. Chronic pain of right knee Patient education given, RICE  - DG Knee Complete 4 Views Right; Future - ibuprofen (ADVIL) 800 MG tablet; Take 1 tablet (800 mg total) by mouth every 8 (eight) hours as needed.  Dispense: 30 tablet; Refill: 0 - Ambulatory referral to Orthopedic Surgery   Meds ordered this encounter  Medications  . ibuprofen (ADVIL) 800 MG tablet    Sig: Take 1 tablet (800 mg total) by  mouth every 8 (eight) hours as needed.    Dispense:  30 tablet    Refill:  0    Order Specific Question:   Supervising Provider    Answer:   Storm Frisk [[id number]]    I have reviewed the patient's medical history (PMH, PSH, Social History, Family History, Medications, and allergies) , and have been updated if relevant. I spent 20 minutes reviewing chart and  face to face time with patient.    Follow-up: Return if symptoms worsen or fail to improve.    Kasandra Knudsen Mayers, PA-C

## 2020-02-29 ENCOUNTER — Other Ambulatory Visit: Payer: Self-pay

## 2020-02-29 NOTE — Addendum Note (Signed)
Addended by: Heidi Dach on: 02/29/2020 09:01 AM   Modules accepted: Orders

## 2020-03-11 ENCOUNTER — Encounter: Payer: Self-pay | Admitting: Physician Assistant

## 2020-03-11 ENCOUNTER — Ambulatory Visit (INDEPENDENT_AMBULATORY_CARE_PROVIDER_SITE_OTHER): Payer: 59 | Admitting: Physician Assistant

## 2020-03-11 ENCOUNTER — Ambulatory Visit (INDEPENDENT_AMBULATORY_CARE_PROVIDER_SITE_OTHER): Payer: 59

## 2020-03-11 DIAGNOSIS — G8929 Other chronic pain: Secondary | ICD-10-CM

## 2020-03-11 DIAGNOSIS — M25561 Pain in right knee: Secondary | ICD-10-CM

## 2020-03-11 NOTE — Progress Notes (Signed)
Office Visit Note   Patient: Francis Coleman           Date of Birth: 09-17-1960           MRN: [id number] Visit Date: 03/11/2020              Requested by: Mayers, Kasandra Knudsen, PA-C [address] [place],  [place] [address] PCP: System, Provider Not In   Assessment & Plan: Visit Diagnoses:  1. Chronic pain of right knee     Plan: Recommend the do quad strengthening exercises for his knee also discussed with him knee friendly exercises. He will pick up Voltaren gel and apply it to the knee 4 grams up to 4 times per day. He also asked about a knee brace recommend an open patella knee brace. Explained to him that he has significant tricompartmental arthritis of the right knee however he has had no conservative treatment recommend cortisone injection approximately 2 weeks status post his second Covid vaccine which is due the first part of December. He will follow-up for that injection in approximately 4 to 5 weeks.  Follow-Up Instructions: Return in about 5 weeks (around 04/15/2020).   Orders:  Orders Placed This Encounter  Procedures  . XR Knee 1-2 Views Right   No orders of the defined types were placed in this encounter.     Procedures: No procedures performed   Clinical Data: No additional findings.   Subjective: Chief Complaint  Patient presents with  . Right Knee - Pain    HPI Francis Coleman is a pleasant 59 year old male were seen for the first time for right knee pain is been ongoing for years. He states that around 2007 he had a fall down some stairs ended up undergoing a knee arthroscopy for a torn meniscus. He states he has had pain in the knee for the past 4 to 5 years with no new injury. He states that it. States rubbing sensation medial aspect the knee and it swells at times. He is taking ibuprofen for the knee pain otherwise no treatment. Pain is worse with ambulating. Notes that the knee does give way on him at times. He has thought about getting a knee  brace but has not gotten 1 as of yet. Review of Systems Negative for fevers chills shortness of breath chest pain  Objective: Vital Signs: There were no vitals taken for this visit.  Physical Exam Constitutional:      Appearance: He is not ill-appearing or diaphoretic.  Pulmonary:     Effort: Pulmonary effort is normal.  Neurological:     Mental Status: He is alert and oriented to person, place, and time.  Psychiatric:        Mood and Affect: Mood normal.     Ortho Exam Bilateral knees no abnormal warmth erythema or effusion. Slight edema of the right knee compared to left. No instability valgus varus stressing of either knee. Good range of motion bilateral knees. Significant patellofemoral crepitus on the right knee with passive range of motion. Tenderness along medial joint line of the right knee.  Specialty Comments:  No specialty comments available.  Imaging: XR Knee 1-2 Views Right  Result Date: 03/11/2020 Right knee 2 views: No acute fractures. Knee is well located. Bone-on-bone medial compartment and severe patellofemoral changes. Mild to moderate lateral compartmental changes. No bony abnormalities otherwise.    PMFS History: Patient Active Problem List   Diagnosis Date Noted  . OSA (obstructive sleep apnea) 01/25/2020  .  Fatigue 01/11/2020  . Essential hypertension 11/09/2019  . Hyperlipidemia 11/09/2019  . Erectile dysfunction 09/14/2019   Past Medical History:  Diagnosis Date  . GERD (gastroesophageal reflux disease)    past hx. -none recent-controls with diet.  . Hypertension   . S/P epidural steroid injection    due to fall and" herniated disc"  . Sleep apnea    no use since 2010    History reviewed. No pertinent family history.  Past Surgical History:  Procedure Laterality Date  . CIRCUMCISION    . COLON SURGERY N/A    [facility]ia 02/12/2020  . COLONOSCOPY WITH PROPOFOL N/A 09/03/2014   Procedure: COLONOSCOPY WITH PROPOFOL;  Surgeon: Charolett Bumpers, MD;  Location: [facility];  Service: Endoscopy;  Laterality: N/A;   Social History   Occupational History  . Not on file  Tobacco Use  . Smoking status: Former Smoker    Packs/day: 0.25    Years: 2.00    Pack years: 0.50  . Smokeless tobacco: Former Neurosurgeon    Quit date: 08/23/1997  Vaping Use  . Vaping Use: Never used  Substance and Sexual Activity  . Alcohol use: Yes    Comment: socially  . Drug use: No  . Sexual activity: Not Currently

## 2020-04-08 ENCOUNTER — Ambulatory Visit: Payer: 59 | Admitting: Orthopaedic Surgery

## 2020-04-08 ENCOUNTER — Telehealth: Payer: Self-pay

## 2020-04-08 NOTE — Telephone Encounter (Signed)
Pt would like 2nd covid 19 shot.

## 2020-04-08 NOTE — Telephone Encounter (Signed)
Patient will be scheduled with [facility] to complete 2hd dose of covid vaccine.

## 2020-04-09 ENCOUNTER — Telehealth: Payer: Self-pay

## 2020-04-09 NOTE — Telephone Encounter (Signed)
Contacted patient regarding upcoming dosage of 2nd COVID Vaccination. NA. Following up with Mychart message also.

## 2020-04-22 ENCOUNTER — Ambulatory Visit: Payer: 59 | Admitting: Orthopaedic Surgery

## 2020-06-10 ENCOUNTER — Telehealth: Payer: Self-pay

## 2020-06-10 NOTE — Telephone Encounter (Signed)
Medication refill

## 2020-06-11 ENCOUNTER — Other Ambulatory Visit: Payer: Self-pay | Admitting: Internal Medicine

## 2020-06-11 ENCOUNTER — Other Ambulatory Visit: Payer: Self-pay

## 2020-06-11 DIAGNOSIS — N529 Male erectile dysfunction, unspecified: Secondary | ICD-10-CM

## 2020-06-11 DIAGNOSIS — I10 Essential (primary) hypertension: Secondary | ICD-10-CM

## 2020-06-11 MED ORDER — LISINOPRIL-HYDROCHLOROTHIAZIDE 10-12.5 MG PO TABS: 1.0000 | ORAL_TABLET | Freq: Every day | ORAL | 0 refills | Status: DC

## 2020-06-11 MED ORDER — SILDENAFIL CITRATE 50 MG PO TABS: 50.0000 mg | ORAL_TABLET | Freq: Every day | ORAL | 3 refills | Status: DC | PRN

## 2020-06-11 NOTE — Telephone Encounter (Signed)
RF sent for BP med, pls review and send Viagra refill if appropriate, pt last seen 01/2020.

## 2020-06-11 NOTE — Telephone Encounter (Signed)
Refill for Viagra sent. Thanks for sending in BP refills!   Marcy Siren, D.O. [facility]  06/11/2020, 3:52 PM

## 2020-09-04 ENCOUNTER — Other Ambulatory Visit: Payer: Self-pay | Admitting: Internal Medicine

## 2020-09-04 DIAGNOSIS — I10 Essential (primary) hypertension: Secondary | ICD-10-CM

## 2020-09-08 ENCOUNTER — Other Ambulatory Visit: Payer: Self-pay | Admitting: Internal Medicine

## 2020-09-08 DIAGNOSIS — E785 Hyperlipidemia, unspecified: Secondary | ICD-10-CM

## 2020-12-13 ENCOUNTER — Ambulatory Visit (INDEPENDENT_AMBULATORY_CARE_PROVIDER_SITE_OTHER): Payer: 59 | Admitting: Family Medicine

## 2020-12-13 ENCOUNTER — Other Ambulatory Visit: Payer: Self-pay

## 2020-12-13 ENCOUNTER — Encounter: Payer: Self-pay | Admitting: Family Medicine

## 2020-12-13 VITALS — BP 133/87 | HR 78 | Temp 98.3°F | Resp 18 | Ht 67.01 in | Wt 197.8 lb

## 2020-12-13 DIAGNOSIS — E785 Hyperlipidemia, unspecified: Secondary | ICD-10-CM

## 2020-12-13 DIAGNOSIS — I10 Essential (primary) hypertension: Secondary | ICD-10-CM

## 2020-12-13 DIAGNOSIS — T464X5A Adverse effect of angiotensin-converting-enzyme inhibitors, initial encounter: Secondary | ICD-10-CM

## 2020-12-13 MED ORDER — HYDROCHLOROTHIAZIDE 25 MG PO TABS
25.0000 mg | ORAL_TABLET | Freq: Every day | ORAL | 0 refills | Status: DC
Start: 2020-12-13 — End: 2021-01-13

## 2020-12-13 NOTE — Progress Notes (Signed)
Pt presents for hypertension follow-up, pt report that he wants to change BP med because causing his lip swelling  Pt experiencing left arm pain

## 2020-12-13 NOTE — Progress Notes (Signed)
Established Patient Office Visit  Subjective:  Patient ID: Francis Coleman, male    DOB: 03-Aug-1960  Age: 60 y.o. MRN: [id number]  CC:  Chief Complaint  Patient presents with   Hypertension    HPI Francis Coleman presents for follow up of hypertension. Patient reports that he has started to have swelling with his meds. Denies SOB. He has not taken it for the past 2-3 days 2/2 sx.   Past Medical History:  Diagnosis Date   GERD (gastroesophageal reflux disease)    past hx. -none recent-controls with diet.   Hypertension    S/P epidural steroid injection    due to fall and" herniated disc"   Sleep apnea    no use since 2010    Past Surgical History:  Procedure Laterality Date   CIRCUMCISION     COLON SURGERY N/A    [facility]reesia 02/12/2020   COLONOSCOPY WITH PROPOFOL N/A 09/03/2014   Procedure: COLONOSCOPY WITH PROPOFOL;  Surgeon: Charolett Bumpers, MD;  Location: [facility];  Service: Endoscopy;  Laterality: N/A;    History reviewed. No pertinent family history.  Social History   Socioeconomic History   Marital status: Widowed    Spouse name: Not on file   Number of children: Not on file   Years of education: Not on file   Highest education level: Not on file  Occupational History   Not on file  Tobacco Use   Smoking status: Former    Packs/day: 0.25    Years: 2.00    Pack years: 0.50    Types: Cigarettes   Smokeless tobacco: Former    Quit date: 08/23/1997  Vaping Use   Vaping Use: Never used  Substance and Sexual Activity   Alcohol use: Yes    Comment: socially   Drug use: No   Sexual activity: Not Currently  Other Topics Concern   Not on file  Social History Narrative   Not on file   Social Determinants of Health   Financial Resource Strain: Not on file  Food Insecurity: Not on file  Transportation Needs: Not on file  Physical Activity: Not on file  Stress: Not on file  Social Connections: Not on file  Intimate Partner Violence: Not on file     Outpatient Medications Prior to Visit  Medication Sig Dispense Refill   aspirin (EC-81 ASPIRIN) 81 MG EC tablet Take 1 tablet (81 mg total) by mouth daily. Swallow whole. 30 tablet 12   atorvastatin (LIPITOR) 40 MG tablet Take 1 tablet (40 mg total) by mouth daily. 90 tablet 3   ibuprofen (ADVIL) 600 MG tablet Take by mouth.     ibuprofen (ADVIL) 800 MG tablet Take 1 tablet (800 mg total) by mouth every 8 (eight) hours as needed. 30 tablet 0   lisinopril-hydrochlorothiazide (ZESTORETIC) 10-12.5 MG tablet Take 1 tablet by mouth daily. *PATIENT NEEDS TO SCHEDULE APPOINTMENT FOR FOLLOW-UP/LABS/BP CHECK PRIOR TO ANY ADDITIONAL REFILLS* 90 tablet 0   sildenafil (VIAGRA) 50 MG tablet Take 1 tablet (50 mg total) by mouth daily as needed for up to 10 doses for erectile dysfunction. 10 tablet 3   tiZANidine (ZANAFLEX) 4 MG tablet Take 4 mg by mouth at bedtime.     No facility-administered medications prior to visit.    No Known Allergies  ROS Review of Systems  Respiratory:  Negative for cough, chest tightness and wheezing.   All other systems reviewed and are negative.    Objective:    Physical Exam  Vitals and nursing note reviewed.  Constitutional:      General: He is not in acute distress. Cardiovascular:     Rate and Rhythm: Normal rate and regular rhythm.  Pulmonary:     Effort: Pulmonary effort is normal. No respiratory distress.     Breath sounds: Normal breath sounds.  Abdominal:     Palpations: Abdomen is soft.     Tenderness: There is no abdominal tenderness.  Neurological:     General: No focal deficit present.     Mental Status: He is alert and oriented to person, place, and time.    BP 133/87 (BP Location: Left Arm, Patient Position: Sitting, Cuff Size: Large)   Pulse 78   Temp 98.3 F (36.8 C)   Resp 18   Ht 5' 7.01" (1.702 m)   Wt 197 lb 12.8 oz (89.7 kg)   SpO2 98%   BMI 30.97 kg/m  Wt Readings from Last 3 Encounters:  12/13/20 197 lb 12.8 oz (89.7 kg)   02/28/20 204 lb (92.5 kg)  01/11/20 189 lb (85.7 kg)     Health Maintenance Due  Topic Date Due   TETANUS/TDAP  Never done   Zoster Vaccines- Shingrix (1 of 2) Never done   COVID-19 Vaccine (2 - Moderna series) 03/27/2020   INFLUENZA VACCINE  11/25/2020        Assessment & Plan:  1. Adverse effect of lisinopril, initial encounter Lisinopril/HCTZ  dc'd. Discussed with patient SE/SX and that med is now listed as allergy  2. Hyperlipidemia, unspecified hyperlipidemia type Continue present management  3. Essential hypertension Med changed to HCTZ 25 mg daily and monitor   follow-up: Return in about 4 weeks (around 01/10/2021) for follow up.    [name], MD

## 2021-01-10 ENCOUNTER — Other Ambulatory Visit: Payer: Self-pay

## 2021-01-13 ENCOUNTER — Ambulatory Visit (INDEPENDENT_AMBULATORY_CARE_PROVIDER_SITE_OTHER): Payer: 59 | Admitting: Family Medicine

## 2021-01-13 ENCOUNTER — Encounter: Payer: Self-pay | Admitting: Family Medicine

## 2021-01-13 ENCOUNTER — Other Ambulatory Visit: Payer: Self-pay

## 2021-01-13 VITALS — BP 131/93 | Temp 98.0°F | Resp 16 | Ht 64.5 in | Wt 192.6 lb

## 2021-01-13 DIAGNOSIS — I10 Essential (primary) hypertension: Secondary | ICD-10-CM

## 2021-01-13 DIAGNOSIS — Z0000 Encounter for general adult medical examination without abnormal findings: Secondary | ICD-10-CM

## 2021-01-13 DIAGNOSIS — Z0001 Encounter for general adult medical examination with abnormal findings: Secondary | ICD-10-CM

## 2021-01-13 MED ORDER — HYDROCHLOROTHIAZIDE 25 MG PO TABS: 25.0000 mg | ORAL_TABLET | Freq: Every day | ORAL | 0 refills | Status: DC

## 2021-01-13 MED ORDER — ATORVASTATIN CALCIUM 40 MG PO TABS: 40.0000 mg | ORAL_TABLET | Freq: Every day | ORAL | 3 refills | Status: DC

## 2021-01-13 MED ORDER — AMLODIPINE BESYLATE 5 MG PO TABS
5.0000 mg | ORAL_TABLET | Freq: Every day | ORAL | 0 refills | Status: DC
Start: 2021-01-13 — End: 2021-04-14

## 2021-01-13 NOTE — Progress Notes (Signed)
Patient is here for follow-up BP medication and CPE

## 2021-01-14 LAB — CMP14+EGFR
ALT: 15 IU/L (ref 0–44)
AST: 18 IU/L (ref 0–40)
Albumin/Globulin Ratio: 1.9 (ref 1.2–2.2)
Albumin: 4.7 g/dL (ref 3.8–4.9)
Alkaline Phosphatase: 59 IU/L (ref 44–121)
BUN/Creatinine Ratio: 15 (ref 10–24)
BUN: 18 mg/dL (ref 8–27)
Bilirubin Total: 0.5 mg/dL (ref 0.0–1.2)
CO2: 20 mmol/L (ref 20–29)
Calcium: 10.3 mg/dL — ABNORMAL HIGH (ref 8.6–10.2)
Chloride: 110 mmol/L — ABNORMAL HIGH (ref 96–106)
Creatinine, Ser: 1.24 mg/dL (ref 0.76–1.27)
Globulin, Total: 2.5 g/dL (ref 1.5–4.5)
Glucose: 80 mg/dL (ref 65–99)
Potassium: 4.3 mmol/L (ref 3.5–5.2)
Sodium: 146 mmol/L — ABNORMAL HIGH (ref 134–144)
Total Protein: 7.2 g/dL (ref 6.0–8.5)
eGFR: 67 mL/min/{1.73_m2} (ref >59)

## 2021-01-14 LAB — CBC WITH DIFFERENTIAL/PLATELET
Basophils Absolute: 0.1 10*3/uL (ref 0.0–0.2)
Basos: 1 %
EOS (ABSOLUTE): 0.2 10*3/uL (ref 0.0–0.4)
Eos: 2 %
Hematocrit: 41.6 % (ref 37.5–51.0)
Hemoglobin: 13.9 g/dL (ref 13.0–17.7)
Immature Grans (Abs): 0 10*3/uL (ref 0.0–0.1)
Immature Granulocytes: 0 %
Lymphocytes Absolute: 1.9 10*3/uL (ref 0.7–3.1)
Lymphs: 23 %
MCH: 28.1 pg (ref 26.6–33.0)
MCHC: 33.4 g/dL (ref 31.5–35.7)
MCV: 84 fL (ref 79–97)
Monocytes Absolute: 0.7 10*3/uL (ref 0.1–0.9)
Monocytes: 8 %
Neutrophils Absolute: 5.3 10*3/uL (ref 1.4–7.0)
Neutrophils: 66 %
Platelets: 284 10*3/uL (ref 150–450)
RBC: 4.95 x10E6/uL (ref 4.14–5.80)
RDW: 14.6 % (ref 11.6–15.4)
WBC: 8.3 10*3/uL (ref 3.4–10.8)

## 2021-01-14 LAB — LIPID PANEL
Chol/HDL Ratio: 3.2 ratio (ref 0.0–5.0)
Cholesterol, Total: 186 mg/dL (ref 100–199)
HDL: 59 mg/dL (ref >39)
LDL Chol Calc (NIH): 106 mg/dL — ABNORMAL HIGH (ref 0–99)
Triglycerides: 117 mg/dL (ref 0–149)
VLDL Cholesterol Cal: 21 mg/dL (ref 5–40)

## 2021-01-14 NOTE — Progress Notes (Signed)
New Patient Office Visit  Subjective:  Patient ID: Francis Coleman, male    DOB: August 14, 1960  Age: 60 y.o. MRN: [id number]  CC:  Chief Complaint  Patient presents with   Follow-up   Annual Exam   Hypertension    HPI WARNELL RASNIC presents for routine annual exam as well as follow up of blood pressure. Patient reports that he has had not SE from the medicine change. He denies acute complaints or concerns.   Past Medical History:  Diagnosis Date   GERD (gastroesophageal reflux disease)    past hx. -none recent-controls with diet.   Hypertension    S/P epidural steroid injection    due to fall and" herniated disc"   Sleep apnea    no use since 2010    Past Surgical History:  Procedure Laterality Date   CIRCUMCISION     COLON SURGERY N/A    [facility]reesia 02/12/2020   COLONOSCOPY WITH PROPOFOL N/A 09/03/2014   Procedure: COLONOSCOPY WITH PROPOFOL;  Surgeon: Garlan Fair, MD;  Location: [facility];  Service: Endoscopy;  Laterality: N/A;    No family history on file.  Social History   Socioeconomic History   Marital status: Widowed    Spouse name: Not on file   Number of children: Not on file   Years of education: Not on file   Highest education level: Not on file  Occupational History   Not on file  Tobacco Use   Smoking status: Former    Packs/day: 0.25    Years: 2.00    Pack years: 0.50    Types: Cigarettes   Smokeless tobacco: Former    Quit date: 08/23/1997  Vaping Use   Vaping Use: Never used  Substance and Sexual Activity   Alcohol use: Yes    Comment: socially   Drug use: No   Sexual activity: Not Currently  Other Topics Concern   Not on file  Social History Narrative   Not on file   Social Determinants of Health   Financial Resource Strain: Not on file  Food Insecurity: Not on file  Transportation Needs: Not on file  Physical Activity: Not on file  Stress: Not on file  Social Connections: Not on file  Intimate Partner Violence: Not on  file    ROS Review of Systems  All other systems reviewed and are negative.  Objective:   Today's Vitals: BP (!) 131/93 (BP Location: Right Arm, Patient Position: Sitting, Cuff Size: Large)   Temp 98 F (36.7 C) (Oral)   Resp 16   Ht 5' 4.5" (1.638 m)   Wt 192 lb 9.6 oz (87.4 kg)   SpO2 96%   BMI 32.55 kg/m   Physical Exam Vitals and nursing note reviewed.  Constitutional:      General: He is not in acute distress. HENT:     Head: Normocephalic and atraumatic.     Right Ear: Tympanic membrane, ear canal and external ear normal.     Left Ear: Tympanic membrane, ear canal and external ear normal.     Nose: Nose normal.     Mouth/Throat:     Mouth: Mucous membranes are moist.     Pharynx: Oropharynx is clear.  Eyes:     Conjunctiva/sclera: Conjunctivae normal.     Pupils: Pupils are equal, round, and reactive to light.  Neck:     Thyroid: No thyromegaly.  Cardiovascular:     Rate and Rhythm: Normal rate and regular rhythm.  Heart sounds: Normal heart sounds. No murmur heard. Pulmonary:     Effort: Pulmonary effort is normal.     Breath sounds: Normal breath sounds.  Abdominal:     General: There is no distension.     Palpations: Abdomen is soft. There is no mass.     Tenderness: There is no abdominal tenderness.     Hernia: There is no hernia in the left inguinal area or right inguinal area.  Genitourinary:    Penis: Normal and circumcised.      Testes: Normal.  Musculoskeletal:        General: Normal range of motion.     Cervical back: Normal range of motion and neck supple.     Right lower leg: No edema.     Left lower leg: No edema.  Skin:    General: Skin is warm and dry.  Neurological:     General: No focal deficit present.     Mental Status: He is alert and oriented to person, place, and time. Mental status is at baseline.  Psychiatric:        Mood and Affect: Mood normal.        Behavior: Behavior normal.    Assessment & Plan:   1. Visit for  well man health check Unremarkable exam. Routine labs obtained - CMP14+EGFR - Lipid Panel - CBC with Differential  2. Essential hypertension Readings remain slightly elevated - will add amlodipine 5 mg to regimen and monitor    Outpatient Encounter Medications as of 01/13/2021  Medication Sig   amLODipine (NORVASC) 5 MG tablet Take 1 tablet (5 mg total) by mouth daily.   aspirin (EC-81 ASPIRIN) 81 MG EC tablet Take 1 tablet (81 mg total) by mouth daily. Swallow whole.   hydrochlorothiazide (HYDRODIURIL) 25 MG tablet Take 1 tablet (25 mg total) by mouth daily.   sildenafil (VIAGRA) 50 MG tablet Take 1 tablet (50 mg total) by mouth daily as needed for up to 10 doses for erectile dysfunction.   [DISCONTINUED] atorvastatin (LIPITOR) 40 MG tablet Take 1 tablet (40 mg total) by mouth daily.   [DISCONTINUED] hydrochlorothiazide (HYDRODIURIL) 25 MG tablet Take 1 tablet (25 mg total) by mouth daily.   atorvastatin (LIPITOR) 40 MG tablet Take 1 tablet (40 mg total) by mouth daily.   ibuprofen (ADVIL) 600 MG tablet Take by mouth. (Patient not taking: Reported on 01/13/2021)   ibuprofen (ADVIL) 800 MG tablet Take 1 tablet (800 mg total) by mouth every 8 (eight) hours as needed. (Patient not taking: Reported on 01/13/2021)   tiZANidine (ZANAFLEX) 4 MG tablet Take 4 mg by mouth at bedtime. (Patient not taking: Reported on 01/13/2021)   No facility-administered encounter medications on file as of 01/13/2021.    Follow-up: Return in about 4 weeks (around 02/10/2021) for follow up.   Becky Sax, MD

## 2021-03-25 ENCOUNTER — Telehealth: Payer: Self-pay | Admitting: *Deleted

## 2021-03-25 NOTE — Telephone Encounter (Signed)
Call place to patient about medication refill. Advise to call nurse for clarification.

## 2021-03-27 ENCOUNTER — Other Ambulatory Visit: Payer: Self-pay | Admitting: Family Medicine

## 2021-03-27 ENCOUNTER — Telehealth: Payer: Self-pay | Admitting: *Deleted

## 2021-03-27 DIAGNOSIS — N529 Male erectile dysfunction, unspecified: Secondary | ICD-10-CM

## 2021-03-27 MED ORDER — SILDENAFIL CITRATE 50 MG PO TABS: 50.0000 mg | ORAL_TABLET | Freq: Every day | ORAL | 0 refills | Status: DC | PRN

## 2021-03-27 NOTE — Telephone Encounter (Signed)
Patient is requesting  a few pill until his upcoming appointment  sildenafil (VIAGRA) 50 MG tablet

## 2021-04-14 ENCOUNTER — Encounter: Payer: Self-pay | Admitting: Family Medicine

## 2021-04-14 ENCOUNTER — Other Ambulatory Visit: Payer: Self-pay

## 2021-04-14 ENCOUNTER — Ambulatory Visit (INDEPENDENT_AMBULATORY_CARE_PROVIDER_SITE_OTHER): Payer: 59 | Admitting: Family Medicine

## 2021-04-14 VITALS — BP 154/91 | HR 85 | Temp 98.1°F | Resp 16 | Wt 204.0 lb

## 2021-04-14 DIAGNOSIS — G4733 Obstructive sleep apnea (adult) (pediatric): Secondary | ICD-10-CM | POA: Diagnosis not present

## 2021-04-14 DIAGNOSIS — E785 Hyperlipidemia, unspecified: Secondary | ICD-10-CM | POA: Diagnosis not present

## 2021-04-14 DIAGNOSIS — I10 Essential (primary) hypertension: Secondary | ICD-10-CM

## 2021-04-14 MED ORDER — AMLODIPINE BESYLATE 10 MG PO TABS
10.0000 mg | ORAL_TABLET | Freq: Every day | ORAL | 0 refills | Status: DC
Start: 2021-04-14 — End: 2021-07-22

## 2021-04-14 MED ORDER — HYDROCHLOROTHIAZIDE 25 MG PO TABS: 25.0000 mg | ORAL_TABLET | Freq: Every day | ORAL | 0 refills | Status: DC

## 2021-04-14 NOTE — Progress Notes (Signed)
Established Patient Office Visit  Subjective:  Patient ID: Francis Coleman, male    DOB: July 18, 1960  Age: 60 y.o. MRN: [id number]  CC:  Chief Complaint  Patient presents with   Follow-up   Medication Refill    HPI Francis Coleman presents for follow up of hypertension. He also reports that he needs to get a cpap machine and has not been able to get one since diagnosed over a year ago.   Past Medical History:  Diagnosis Date   GERD (gastroesophageal reflux disease)    past hx. -none recent-controls with diet.   Hypertension    S/P epidural steroid injection    due to fall and" herniated disc"   Sleep apnea    no use since 2010    Past Surgical History:  Procedure Laterality Date   CIRCUMCISION     COLON SURGERY N/A    [facility]reesia 02/12/2020   COLONOSCOPY WITH PROPOFOL N/A 09/03/2014   Procedure: COLONOSCOPY WITH PROPOFOL;  Surgeon: Charolett Bumpers, MD;  Location: [facility];  Service: Endoscopy;  Laterality: N/A;    No family history on file.  Social History   Socioeconomic History   Marital status: Widowed    Spouse name: Not on file   Number of children: Not on file   Years of education: Not on file   Highest education level: Not on file  Occupational History   Not on file  Tobacco Use   Smoking status: Former    Packs/day: 0.25    Years: 2.00    Pack years: 0.50    Types: Cigarettes   Smokeless tobacco: Former    Quit date: 08/23/1997  Vaping Use   Vaping Use: Never used  Substance and Sexual Activity   Alcohol use: Yes    Comment: socially   Drug use: No   Sexual activity: Not Currently  Other Topics Concern   Not on file  Social History Narrative   Not on file   Social Determinants of Health   Financial Resource Strain: Not on file  Food Insecurity: Not on file  Transportation Needs: Not on file  Physical Activity: Not on file  Stress: Not on file  Social Connections: Not on file  Intimate Partner Violence: Not on file    ROS Review  of Systems  Psychiatric/Behavioral:  Positive for sleep disturbance.   All other systems reviewed and are negative.  Objective:   Today's Vitals: BP (!) 154/91    Pulse 85    Temp 98.1 F (36.7 C) (Oral)    Resp 16    Wt 204 lb (92.5 kg)    SpO2 97%    BMI 34.48 kg/m   Physical Exam Vitals and nursing note reviewed.  Constitutional:      General: He is not in acute distress. Cardiovascular:     Rate and Rhythm: Normal rate and regular rhythm.  Pulmonary:     Effort: Pulmonary effort is normal.     Breath sounds: Normal breath sounds.  Abdominal:     Palpations: Abdomen is soft.     Tenderness: There is no abdominal tenderness.  Neurological:     General: No focal deficit present.     Mental Status: He is alert and oriented to person, place, and time.    Assessment & Plan:   1. Obstructive sleep apnea Order for cpap placed.   - For home use only DME continuous positive airway pressure (CPAP)  2. Essential hypertension Elevated reading. Compliance discussed.  Meds refilled and amlodipine increased from 5 mg to 10 mg daily. Monitor   - hydrochlorothiazide (HYDRODIURIL) 25 MG tablet; Take 1 tablet (25 mg total) by mouth daily.  Dispense: 90 tablet; Refill: 0 - amLODipine (NORVASC) 10 MG tablet; Take 1 tablet (10 mg total) by mouth daily.  Dispense: 90 tablet; Refill: 0  3. Hyperlipidemia, unspecified hyperlipidemia type Continue present management. continue    Outpatient Encounter Medications as of 04/14/2021  Medication Sig   amLODipine (NORVASC) 10 MG tablet Take 1 tablet (10 mg total) by mouth daily.   aspirin (EC-81 ASPIRIN) 81 MG EC tablet Take 1 tablet (81 mg total) by mouth daily. Swallow whole.   atorvastatin (LIPITOR) 40 MG tablet Take 1 tablet (40 mg total) by mouth daily.   hydrochlorothiazide (HYDRODIURIL) 25 MG tablet Take 1 tablet (25 mg total) by mouth daily.   ibuprofen (ADVIL) 600 MG tablet Take by mouth. (Patient not taking: Reported on 01/13/2021)    ibuprofen (ADVIL) 800 MG tablet Take 1 tablet (800 mg total) by mouth every 8 (eight) hours as needed. (Patient not taking: Reported on 01/13/2021)   sildenafil (VIAGRA) 50 MG tablet Take 1 tablet (50 mg total) by mouth daily as needed for up to 10 doses for erectile dysfunction.   tiZANidine (ZANAFLEX) 4 MG tablet Take 4 mg by mouth at bedtime. (Patient not taking: Reported on 01/13/2021)   [DISCONTINUED] amLODipine (NORVASC) 5 MG tablet Take 1 tablet (5 mg total) by mouth daily.   [DISCONTINUED] hydrochlorothiazide (HYDRODIURIL) 25 MG tablet Take 1 tablet (25 mg total) by mouth daily.   No facility-administered encounter medications on file as of 04/14/2021.    Follow-up: Return in about 6 weeks (around 05/26/2021) for follow up.   [name], MD

## 2021-04-14 NOTE — Progress Notes (Signed)
Patient request a sleep study for possible sleep apnea. Patient was suppose to get fitted for machine back in 2021. Patient is still having trouble sleeping.

## 2021-05-26 ENCOUNTER — Other Ambulatory Visit: Payer: Self-pay

## 2021-05-26 ENCOUNTER — Encounter: Payer: Self-pay | Admitting: Family Medicine

## 2021-05-26 ENCOUNTER — Ambulatory Visit (INDEPENDENT_AMBULATORY_CARE_PROVIDER_SITE_OTHER): Payer: 59 | Admitting: Family Medicine

## 2021-05-26 VITALS — BP 133/82 | HR 76 | Temp 98.2°F | Resp 16 | Wt 203.2 lb

## 2021-05-26 DIAGNOSIS — R351 Nocturia: Secondary | ICD-10-CM

## 2021-05-26 DIAGNOSIS — I10 Essential (primary) hypertension: Secondary | ICD-10-CM

## 2021-05-26 DIAGNOSIS — N529 Male erectile dysfunction, unspecified: Secondary | ICD-10-CM | POA: Diagnosis not present

## 2021-05-26 MED ORDER — TAMSULOSIN HCL 0.4 MG PO CAPS: 0.4000 mg | ORAL_CAPSULE | Freq: Every day | ORAL | 1 refills | Status: DC

## 2021-05-26 MED ORDER — SILDENAFIL CITRATE 20 MG PO TABS: 20.0000 mg | ORAL_TABLET | Freq: Every day | ORAL | 0 refills | Status: DC | PRN

## 2021-05-26 NOTE — Progress Notes (Signed)
Patient is requesting Tadal for ED . Patient said it will be much cheaper.  Patient has no other concerns today

## 2021-05-27 ENCOUNTER — Other Ambulatory Visit: Payer: Self-pay | Admitting: *Deleted

## 2021-05-28 NOTE — Progress Notes (Signed)
Established Patient Office Visit  Subjective:  Patient ID: Francis Coleman, male    DOB: 11/01/1960  Age: 61 y.o. MRN: [id number]  CC:  Chief Complaint  Patient presents with   Follow-up   Hypertension    HPI CHEVIS WEISENSEL presents for follow up of hypertension. He also complains of frequent urination at night in particular. He also would like a cheaper alternative for viagra for ED.  Past Medical History:  Diagnosis Date   GERD (gastroesophageal reflux disease)    past hx. -none recent-controls with diet.   Hypertension    S/P epidural steroid injection    due to fall and" herniated disc"   Sleep apnea    no use since 2010    Past Surgical History:  Procedure Laterality Date   CIRCUMCISION     COLON SURGERY N/A    [facility]ia 02/12/2020   COLONOSCOPY WITH PROPOFOL N/A 09/03/2014   Procedure: COLONOSCOPY WITH PROPOFOL;  Surgeon: Charolett Bumpers, MD;  Location: [facility];  Service: Endoscopy;  Laterality: N/A;    No family history on file.  Social History   Socioeconomic History   Marital status: Widowed    Spouse name: Not on file   Number of children: Not on file   Years of education: Not on file   Highest education level: Not on file  Occupational History   Not on file  Tobacco Use   Smoking status: Former    Packs/day: 0.25    Years: 2.00    Pack years: 0.50    Types: Cigarettes   Smokeless tobacco: Former    Quit date: 08/23/1997  Vaping Use   Vaping Use: Never used  Substance and Sexual Activity   Alcohol use: Yes    Comment: socially   Drug use: No   Sexual activity: Not Currently  Other Topics Concern   Not on file  Social History Narrative   Not on file   Social Determinants of Health   Financial Resource Strain: Not on file  Food Insecurity: Not on file  Transportation Needs: Not on file  Physical Activity: Not on file  Stress: Not on file  Social Connections: Not on file  Intimate Partner Violence: Not on file    ROS Review  of Systems  Genitourinary:  Positive for frequency. Negative for dysuria.  All other systems reviewed and are negative.  Objective:   Today's Vitals: BP 133/82    Pulse 76    Temp 98.2 F (36.8 C) (Oral)    Resp 16    Wt 203 lb 3.2 oz (92.2 kg)    BMI 34.34 kg/m   Physical Exam Vitals and nursing note reviewed.  Constitutional:      General: He is not in acute distress.    Appearance: He is obese.  Cardiovascular:     Rate and Rhythm: Normal rate and regular rhythm.  Pulmonary:     Effort: Pulmonary effort is normal.     Breath sounds: Normal breath sounds.  Abdominal:     Palpations: Abdomen is soft.     Tenderness: There is no abdominal tenderness.  Neurological:     General: No focal deficit present.     Mental Status: He is alert and oriented to person, place, and time.    Assessment & Plan:   1. Nocturia Flomax prescribed. Will monitor. Continue   2. Essential hypertension Appears stable with present management. Continue   3. Erectile dysfunction, unspecified erectile dysfunction type Sildenafil prescribed  Outpatient Encounter Medications as of 05/26/2021  Medication Sig   amLODipine (NORVASC) 10 MG tablet Take 1 tablet (10 mg total) by mouth daily.   aspirin (EC-81 ASPIRIN) 81 MG EC tablet Take 1 tablet (81 mg total) by mouth daily. Swallow whole.   atorvastatin (LIPITOR) 40 MG tablet Take 1 tablet (40 mg total) by mouth daily.   hydrochlorothiazide (HYDRODIURIL) 25 MG tablet Take 1 tablet (25 mg total) by mouth daily.   sildenafil (REVATIO) 20 MG tablet Take 1 tablet (20 mg total) by mouth daily as needed.   tamsulosin (FLOMAX) 0.4 MG CAPS capsule Take 1 capsule (0.4 mg total) by mouth daily.   [DISCONTINUED] ibuprofen (ADVIL) 600 MG tablet Take by mouth. (Patient not taking: Reported on 01/13/2021)   [DISCONTINUED] ibuprofen (ADVIL) 800 MG tablet Take 1 tablet (800 mg total) by mouth every 8 (eight) hours as needed. (Patient not taking: Reported on  01/13/2021)   [DISCONTINUED] sildenafil (VIAGRA) 50 MG tablet Take 1 tablet (50 mg total) by mouth daily as needed for up to 10 doses for erectile dysfunction.   [DISCONTINUED] tiZANidine (ZANAFLEX) 4 MG tablet Take 4 mg by mouth at bedtime. (Patient not taking: Reported on 01/13/2021)   No facility-administered encounter medications on file as of 05/26/2021.    Follow-up: Return in about 6 weeks (around 07/07/2021) for follow up, chronic med issues.   [name], MD

## 2021-05-30 ENCOUNTER — Telehealth: Payer: Self-pay | Admitting: *Deleted

## 2021-05-30 NOTE — Telephone Encounter (Signed)
[facility] do not supply DME for this patient . I faxed an  order to [facility] in 05/30/2021

## 2021-06-05 ENCOUNTER — Telehealth: Payer: Self-pay | Admitting: Family Medicine

## 2021-06-05 NOTE — Telephone Encounter (Signed)
Patient is aware that Francis Coleman has been notified of DME supplies

## 2021-06-05 NOTE — Telephone Encounter (Signed)
Pt inquiring about status about CPAP machine ordered by PCP.

## 2021-07-08 ENCOUNTER — Ambulatory Visit: Payer: 59 | Admitting: Family Medicine

## 2021-07-21 ENCOUNTER — Other Ambulatory Visit: Payer: Self-pay | Admitting: Family Medicine

## 2021-07-21 DIAGNOSIS — I10 Essential (primary) hypertension: Secondary | ICD-10-CM

## 2021-12-01 ENCOUNTER — Other Ambulatory Visit: Payer: Self-pay | Admitting: Family Medicine

## 2021-12-01 DIAGNOSIS — I10 Essential (primary) hypertension: Secondary | ICD-10-CM

## 2021-12-03 ENCOUNTER — Ambulatory Visit (INDEPENDENT_AMBULATORY_CARE_PROVIDER_SITE_OTHER): Payer: 59 | Admitting: Family Medicine

## 2021-12-03 ENCOUNTER — Encounter: Payer: Self-pay | Admitting: Family Medicine

## 2021-12-03 VITALS — BP 110/72 | HR 105 | Temp 98.1°F | Resp 16 | Ht 66.0 in | Wt 207.0 lb

## 2021-12-03 DIAGNOSIS — R350 Frequency of micturition: Secondary | ICD-10-CM

## 2021-12-03 LAB — POCT URINALYSIS DIP (CLINITEK)
Bilirubin, UA: NEGATIVE
Glucose, UA: NEGATIVE mg/dL
Ketones, POC UA: NEGATIVE mg/dL
Leukocytes, UA: NEGATIVE
Nitrite, UA: NEGATIVE
POC PROTEIN,UA: NEGATIVE
Spec Grav, UA: >=1.03 — AB (ref 1.010–1.025)
Urobilinogen, UA: 0.2 E.U./dL
pH, UA: 5.5 (ref 5.0–8.0)

## 2021-12-04 ENCOUNTER — Encounter: Payer: Self-pay | Admitting: Family Medicine

## 2021-12-04 NOTE — Progress Notes (Signed)
Established Patient Office Visit  Subjective    Patient ID: Francis Coleman, male    DOB: 1960/06/13  Age: 61 y.o. MRN: [id number]  CC: No chief complaint on file.   HPI Francis Coleman presents for complaint of urinary frequency that is worsening.    Outpatient Encounter Medications as of 12/03/2021  Medication Sig   amLODipine (NORVASC) 10 MG tablet TAKE 1 TABLET(10 MG) BY MOUTH DAILY   aspirin (EC-81 ASPIRIN) 81 MG EC tablet Take 1 tablet (81 mg total) by mouth daily. Swallow whole.   atorvastatin (LIPITOR) 40 MG tablet Take 1 tablet (40 mg total) by mouth daily.   hydrochlorothiazide (HYDRODIURIL) 25 MG tablet TAKE 1 TABLET(25 MG) BY MOUTH DAILY   sildenafil (REVATIO) 20 MG tablet Take 1 tablet (20 mg total) by mouth daily as needed.   tamsulosin (FLOMAX) 0.4 MG CAPS capsule Take 1 capsule (0.4 mg total) by mouth daily.   [DISCONTINUED] amLODipine (NORVASC) 5 MG tablet Take 5 mg by mouth daily.   No facility-administered encounter medications on file as of 12/03/2021.    Past Medical History:  Diagnosis Date   GERD (gastroesophageal reflux disease)    past hx. -none recent-controls with diet.   Hypertension    S/P epidural steroid injection    due to fall and" herniated disc"   Sleep apnea    no use since 2010    Past Surgical History:  Procedure Laterality Date   CIRCUMCISION     COLON SURGERY N/A    [facility] 02/12/2020   COLONOSCOPY WITH PROPOFOL N/A 09/03/2014   Procedure: COLONOSCOPY WITH PROPOFOL;  Surgeon: Charolett Bumpers, MD;  Location: [facility];  Service: Endoscopy;  Laterality: N/A;    No family history on file.  Social History   Socioeconomic History   Marital status: Widowed    Spouse name: Not on file   Number of children: Not on file   Years of education: Not on file   Highest education level: Not on file  Occupational History   Not on file  Tobacco Use   Smoking status: Former    Packs/day: 0.25    Years: 2.00    Total pack years:  0.50    Types: Cigarettes   Smokeless tobacco: Former    Quit date: 08/23/1997  Vaping Use   Vaping Use: Never used  Substance and Sexual Activity   Alcohol use: Yes    Comment: socially   Drug use: No   Sexual activity: Not Currently  Other Topics Concern   Not on file  Social History Narrative   Not on file   Social Determinants of Health   Financial Resource Strain: Not on file  Food Insecurity: Not on file  Transportation Needs: Not on file  Physical Activity: Not on file  Stress: Not on file  Social Connections: Not on file  Intimate Partner Violence: Not on file    Review of Systems  Genitourinary:  Positive for frequency.  All other systems reviewed and are negative.       Objective    BP 110/72   Pulse (!) 105   Temp 98.1 F (36.7 C) (Oral)   Resp 16   Ht 5\' 6"  (1.676 m)   Wt 207 lb (93.9 kg)   SpO2 96%   BMI 33.41 kg/m   Physical Exam Vitals and nursing note reviewed.  Constitutional:      General: He is not in acute distress. Cardiovascular:     Rate and  Rhythm: Normal rate and regular rhythm.  Pulmonary:     Effort: Pulmonary effort is normal.     Breath sounds: Normal breath sounds.  Abdominal:     Palpations: Abdomen is soft.     Tenderness: There is no abdominal tenderness.  Genitourinary:    Comments: deferred Neurological:     General: No focal deficit present.     Mental Status: He is alert and oriented to person, place, and time.         Assessment & Plan:   1. Urine frequency Patient referred to urology for further eval/mgt - POCT URINALYSIS DIP (CLINITEK) - Ambulatory referral to Urology    No follow-ups on file.   [name], MD

## 2021-12-18 ENCOUNTER — Ambulatory Visit: Payer: 59 | Admitting: Family Medicine

## 2022-01-02 ENCOUNTER — Telehealth: Payer: Self-pay | Admitting: Family Medicine

## 2022-01-02 NOTE — Telephone Encounter (Signed)
I have attempted without success to contact this patient by phone to return their call and I left a message on answering machine.

## 2022-01-02 NOTE — Telephone Encounter (Signed)
Urgent Care registrar received Medical Certification for Application/Renewal of Disability Parking Placard for this pt during PCE's office closed for lunchtime.   Please advise and thank you. Placed in provider bin.

## 2022-01-19 NOTE — Telephone Encounter (Signed)
Pt called to see if his Placard was ready for pick up / please advise asap

## 2022-01-21 NOTE — Telephone Encounter (Signed)
Patient is aware of pcp decision not to sigh place card right now

## 2022-01-28 ENCOUNTER — Ambulatory Visit: Payer: Self-pay

## 2022-01-28 ENCOUNTER — Encounter: Payer: Self-pay | Admitting: Orthopaedic Surgery

## 2022-01-28 ENCOUNTER — Ambulatory Visit (INDEPENDENT_AMBULATORY_CARE_PROVIDER_SITE_OTHER): Payer: No Typology Code available for payment source | Admitting: Orthopaedic Surgery

## 2022-01-28 VITALS — BP 135/80 | HR 91 | Ht 64.0 in | Wt 200.0 lb

## 2022-01-28 DIAGNOSIS — M7542 Impingement syndrome of left shoulder: Secondary | ICD-10-CM | POA: Insufficient documentation

## 2022-01-28 DIAGNOSIS — M4722 Other spondylosis with radiculopathy, cervical region: Secondary | ICD-10-CM | POA: Diagnosis not present

## 2022-01-28 DIAGNOSIS — M542 Cervicalgia: Secondary | ICD-10-CM

## 2022-01-28 DIAGNOSIS — M47812 Spondylosis without myelopathy or radiculopathy, cervical region: Secondary | ICD-10-CM | POA: Diagnosis not present

## 2022-01-28 MED ORDER — BUPIVACAINE HCL 0.25 % IJ SOLN
4.0000 mL | INTRAMUSCULAR | Status: AC | PRN
  Administered 2022-01-28: 4 mL via INTRA_ARTICULAR

## 2022-01-28 MED ORDER — LIDOCAINE HCL 1 % IJ SOLN
0.5000 mL | INTRAMUSCULAR | Status: AC | PRN
  Administered 2022-01-28: .5 mL

## 2022-01-28 MED ORDER — METHYLPREDNISOLONE ACETATE 40 MG/ML IJ SUSP
40.0000 mg | INTRAMUSCULAR | Status: AC | PRN
  Administered 2022-01-28: 40 mg via INTRA_ARTICULAR

## 2022-01-28 NOTE — Progress Notes (Signed)
Office Visit Note   Patient: Francis Coleman           Date of Birth: [birth year]           MRN: [id number] Visit Date: 01/28/2022              Requested by: Dorna Mai, [address] [place],  [place] [id number] PCP: Dorna Mai, MD   Assessment & Plan: Visit Diagnoses:  1. Neck pain   2. Cervical spondylosis   3. Other spondylosis with radiculopathy, cervical region   4. Impingement syndrome of left shoulder     Plan: Subacromial injection performed with better than 50% pain relief in his shoulder.  He get his arm up over his head much easier.  We will proceed with cervical MRI scan.  To evaluate him for his radicular symptoms.  Work slip given for continued light duty as he is doing until he returns for MRI review.  Follow-Up Instructions: No follow-ups on file.   Orders:  Orders Placed This Encounter  Procedures   XR Cervical Spine 2 or 3 views   MR Cervical Spine w/o contrast   No orders of the defined types were placed in this encounter.     Procedures: Large Joint Inj: L subacromial bursa on 01/28/2022 1:09 PM Indications: pain Details: 22 G 1.5 in needle  Arthrogram: No  Medications: 4 mL bupivacaine 0.25 %; 40 mg methylPREDNISolone acetate 40 MG/ML; 0.5 mL lidocaine 1 % Outcome: tolerated well, no immediate complications Procedure, treatment alternatives, risks and benefits explained, specific risks discussed. Consent was given by the patient. Immediately prior to procedure a time out was called to verify the correct patient, procedure, equipment, support staff and site/side marked as required. Patient was prepped and draped in the usual sterile fashion.       Clinical Data: No additional findings.   Subjective: Chief Complaint  Patient presents with   Left Shoulder - Pain    OTJI 12/31/2021   Neck - Pain    OTJI 12/31/2021    HPI 61 year old male with shoulder pain with an on-the-job injury 12/31/2021.  He was seen in [place] and had an MRI of his shoulder done which showed intrasubstance rotator cuff partial tearing that did not appear to extend to the surface.  Patient states a door latch hung up on a truck and had to climb inside to open the door was coming back he missed a step falling backwards and states he hit his head.  He had pain in his left side difficulty lifting his left arm over his head and pain in his neck difficulty turning his neck with numbness and tingling.  He has been on light duty.  He states he has been working for the company since about 2018.  Patient is used ibuprofen with slight improvement.  He states he has trouble getting his shirt on and off reaching overhead with the left arm no problems with the right arm.  He has pain in his neck with pain that radiates down to his wrist and describes the pain as sharp.  Review of Systems patient takes medications for hypertension hyperlipidemia.  All other systems are noncontributory HPI.   Objective: Vital Signs: BP 135/80   Pulse 91   Ht 5\' 4"  (1.626 m)   Wt 200 lb (90.7 kg)   BMI 34.33 kg/m   Physical Exam Constitutional:      Appearance: He is well-developed.  HENT:  Head: Normocephalic and atraumatic.     Right Ear: External ear normal.     Left Ear: External ear normal.  Eyes:     Pupils: Pupils are equal, round, and reactive to light.  Neck:     Thyroid: No thyromegaly.     Trachea: No tracheal deviation.  Cardiovascular:     Rate and Rhythm: Normal rate.  Pulmonary:     Effort: Pulmonary effort is normal.     Breath sounds: No wheezing.  Abdominal:     General: Bowel sounds are normal.     Palpations: Abdomen is soft.  Musculoskeletal:     Cervical back: Neck supple.  Skin:    General: Skin is warm and dry.     Capillary Refill: Capillary refill takes less than 2 seconds.  Neurological:     Mental Status: He is alert and oriented to person, place, and time.  Psychiatric:        Behavior:  Behavior normal.        Thought Content: Thought content normal.        Judgment: Judgment normal.     Ortho Exam patient has extreme brachial plexus tenderness on the left negative on the right.  He complains of radicular symptoms with positive trapezial squeeze.  Increased pain with cervical compression he cannot relax for cervical distraction continues to jump and compress his neck.  He has only 30% rotation right left extremely slow and deliberate.  Upper extremity reflexes are good he has some cogwheel resistive testing.  Positive impingement left shoulder with and without distraction.  Specialty Comments:  No specialty comments available.  Imaging: CLINICAL DATA: [place] at work. Persistent shoulder pain.  EXAM: MRI OF THE LEFT SHOULDER WITHOUT CONTRAST  TECHNIQUE: Multiplanar, multisequence MR imaging of the shoulder was performed. No intravenous contrast was administered.  COMPARISON: None Available.  FINDINGS: Rotator cuff: Significant rim rent type articular surface tear involving the footprint attachment fibers of the infraspinatus and supraspinatus tendons. Technically this is an intrasubstance tear as I do not think it would be seen arthroscopically. Tear is approximately 5 mm wide and 17 mm long. It does approach the bursal surface but I do not see a definite bursal surface tear. Probably at risk for a full-thickness tear. The subscapularis tendon is intact.  Muscles: No significant findings.  Biceps long head: Intact  Acromioclavicular Joint: Mild/moderate degenerative changes. Type 1 acromion. No lateral downsloping. Mild subacromial spurring.  Glenohumeral Joint: Mild/early degenerative changes no joint effusion synovitis.  Labrum: No labral tears.  Bones: No acute bony findings.  Other: Mild subacromial/subdeltoid bursitis.  IMPRESSION: 1. Significant rim rent type articular surface tear involving the footprint attachment fibers of the infraspinatus  and supraspinatus tendons. Technically this is an intrasubstance tear as I do not think it would be seen arthroscopically. The tear is fairly deep, approaching the bursal fibers and probably at risk for full-thickness tear. 2. Intact long head biceps tendon and glenoid labrum. 3. Mild/moderate AC joint degenerative changes and mild subacromial spurring. 4. Mild subacromial/subdeltoid bursitis.   Electronically Signed By: Marijo Sanes M.D. On: 01/16/2022 09:03   PMFS History: Patient Active Problem List   Diagnosis Date Noted   Other spondylosis with radiculopathy, cervical region 01/28/2022   Impingement syndrome of left shoulder 01/28/2022   OSA (obstructive sleep apnea) 01/25/2020   Fatigue 01/11/2020   Essential hypertension 11/09/2019   Hyperlipidemia 11/09/2019   Erectile dysfunction 09/14/2019   Past Medical History:  Diagnosis Date   GERD (gastroesophageal  reflux disease)    past hx. -none recent-controls with diet.   Hypertension    S/P epidural steroid injection    due to fall and" herniated disc"   Sleep apnea    no use since 2010    No family history on file.  Past Surgical History:  Procedure Laterality Date   CIRCUMCISION     COLON SURGERY N/A    [facility]ia 02/12/2020   COLONOSCOPY WITH PROPOFOL N/A 09/03/2014   Procedure: COLONOSCOPY WITH PROPOFOL;  Surgeon: Garlan Fair, MD;  Location: [facility];  Service: Endoscopy;  Laterality: N/A;   Social History   Occupational History   Not on file  Tobacco Use   Smoking status: Former    Packs/day: 0.25    Years: 2.00    Total pack years: 0.50    Types: Cigarettes   Smokeless tobacco: Former    Quit date: 08/23/1997  Vaping Use   Vaping Use: Never used  Substance and Sexual Activity   Alcohol use: Yes    Comment: socially   Drug use: No   Sexual activity: Not Currently

## 2022-02-27 ENCOUNTER — Other Ambulatory Visit: Payer: Self-pay | Admitting: Family Medicine

## 2022-02-27 NOTE — Telephone Encounter (Signed)
Requested medication (s) are due for refill today: expired medication  Requested medication (s) are on the active medication list: yes  Last refill:  01/13/22 #90 3 refills  Future visit scheduled: yes in 3 months  Notes to clinic:  expired medication. Protocol failed last labs 01/13/21. Do you want to renew Rx?     Requested Prescriptions  Pending Prescriptions Disp Refills   atorvastatin (LIPITOR) 40 MG tablet [Pharmacy Med Name: ATORVASTATIN 40MG  TABLETS] 90 tablet 3    Sig: TAKE 1 TABLET(40 MG) BY MOUTH DAILY     Cardiovascular:  Antilipid - Statins Failed - 02/27/2022  6:54 AM      Failed - Lipid Panel in normal range within the last 12 months    Cholesterol, Total  Date Value Ref Range Status  01/13/2021 186 100 - 199 mg/dL Final   LDL Chol Calc (NIH)  Date Value Ref Range Status  01/13/2021 106 (H) 0 - 99 mg/dL Final   HDL  Date Value Ref Range Status  01/13/2021 59 >39 mg/dL Final   Triglycerides  Date Value Ref Range Status  01/13/2021 117 0 - 149 mg/dL Final         Passed - Patient is not pregnant      Passed - Valid encounter within last 12 months    Recent Outpatient Visits           2 months ago Urine frequency   [facility] at [facility], MD   9 months ago Nocturia   [facility] at [facility], MD   10 months ago Obstructive sleep apnea   Primary Care at [facility], MD   1 year ago Visit for well man health check   [facility] Care at [facility], MD   1 year ago Adverse effect of lisinopril, initial encounter   [facility] Care at [facility], MD       Future Appointments             In 3 months Dorna Mai, MD Primary Care at [facility]

## 2022-03-07 ENCOUNTER — Other Ambulatory Visit: Payer: Self-pay | Admitting: Family Medicine

## 2022-03-07 DIAGNOSIS — I10 Essential (primary) hypertension: Secondary | ICD-10-CM

## 2022-04-06 ENCOUNTER — Encounter: Payer: Self-pay | Admitting: Family Medicine

## 2022-04-06 ENCOUNTER — Ambulatory Visit (INDEPENDENT_AMBULATORY_CARE_PROVIDER_SITE_OTHER): Payer: 59 | Admitting: Family Medicine

## 2022-04-06 VITALS — BP 138/89 | HR 92 | Temp 98.1°F | Resp 16 | Wt 210.8 lb

## 2022-04-06 DIAGNOSIS — Z1211 Encounter for screening for malignant neoplasm of colon: Secondary | ICD-10-CM

## 2022-04-06 DIAGNOSIS — E7841 Elevated Lipoprotein(a): Secondary | ICD-10-CM | POA: Diagnosis not present

## 2022-04-06 DIAGNOSIS — I10 Essential (primary) hypertension: Secondary | ICD-10-CM

## 2022-04-06 MED ORDER — AMLODIPINE BESYLATE 10 MG PO TABS: ORAL_TABLET | ORAL | 1 refills | Status: DC

## 2022-04-06 MED ORDER — ATORVASTATIN CALCIUM 40 MG PO TABS: ORAL_TABLET | ORAL | 1 refills | Status: DC

## 2022-04-06 MED ORDER — HYDROCHLOROTHIAZIDE 25 MG PO TABS: ORAL_TABLET | ORAL | 1 refills | Status: DC

## 2022-04-09 ENCOUNTER — Other Ambulatory Visit: Payer: Self-pay | Admitting: Family Medicine

## 2022-04-09 NOTE — Progress Notes (Signed)
Established Patient Office Visit  Subjective    Patient ID: Francis Coleman, male    DOB: 08-28-1960  Age: 61 y.o. MRN: [id number]  CC: No chief complaint on file.   HPI Francis Coleman presents for routine follow up of chronic med issues. Patient denies acute complaints or concerns.    Outpatient Encounter Medications as of 04/06/2022  Medication Sig   aspirin (EC-81 ASPIRIN) 81 MG EC tablet Take 1 tablet (81 mg total) by mouth daily. Swallow whole.   sildenafil (REVATIO) 20 MG tablet Take 1 tablet (20 mg total) by mouth daily as needed.   tamsulosin (FLOMAX) 0.4 MG CAPS capsule Take 1 capsule (0.4 mg total) by mouth daily.   tiZANidine (ZANAFLEX) 4 MG tablet Take 4 mg by mouth 2 (two) times daily.   [DISCONTINUED] amLODipine (NORVASC) 10 MG tablet TAKE 1 TABLET(10 MG) BY MOUTH DAILY   [DISCONTINUED] atorvastatin (LIPITOR) 40 MG tablet TAKE 1 TABLET(40 MG) BY MOUTH DAILY   [DISCONTINUED] hydrochlorothiazide (HYDRODIURIL) 25 MG tablet TAKE 1 TABLET(25 MG) BY MOUTH DAILY   amLODipine (NORVASC) 10 MG tablet TAKE 1 TABLET(10 MG) BY MOUTH DAILY   atorvastatin (LIPITOR) 40 MG tablet TAKE 1 TABLET(40 MG) BY MOUTH DAILY   hydrochlorothiazide (HYDRODIURIL) 25 MG tablet TAKE 1 TABLET(25 MG) BY MOUTH DAILY   No facility-administered encounter medications on file as of 04/06/2022.    Past Medical History:  Diagnosis Date   GERD (gastroesophageal reflux disease)    past hx. -none recent-controls with diet.   Hypertension    S/P epidural steroid injection    due to fall and" herniated disc"   Sleep apnea    no use since 2010    Past Surgical History:  Procedure Laterality Date   CIRCUMCISION     COLON SURGERY N/A    [facility] 02/12/2020   COLONOSCOPY WITH PROPOFOL N/A 09/03/2014   Procedure: COLONOSCOPY WITH PROPOFOL;  Surgeon: Charolett Bumpers, MD;  Location: [facility];  Service: Endoscopy;  Laterality: N/A;    History reviewed. No pertinent family history.  Social History    Socioeconomic History   Marital status: Widowed    Spouse name: Not on file   Number of children: Not on file   Years of education: Not on file   Highest education level: Not on file  Occupational History   Not on file  Tobacco Use   Smoking status: Former    Packs/day: 0.25    Years: 2.00    Total pack years: 0.50    Types: Cigarettes   Smokeless tobacco: Former    Quit date: 08/23/1997  Vaping Use   Vaping Use: Never used  Substance and Sexual Activity   Alcohol use: Yes    Comment: socially   Drug use: No   Sexual activity: Not Currently  Other Topics Concern   Not on file  Social History Narrative   Not on file   Social Determinants of Health   Financial Resource Strain: Not on file  Food Insecurity: Not on file  Transportation Needs: Not on file  Physical Activity: Not on file  Stress: Not on file  Social Connections: Not on file  Intimate Partner Violence: Not on file    Review of Systems  All other systems reviewed and are negative.       Objective    BP 138/89   Pulse 92   Temp 98.1 F (36.7 C) (Oral)   Resp 16   Wt 210 lb 12.8 oz (95.6  kg)   SpO2 99%   BMI 36.18 kg/m   Physical Exam Vitals and nursing note reviewed.  Constitutional:      General: He is not in acute distress. Cardiovascular:     Rate and Rhythm: Normal rate and regular rhythm.  Pulmonary:     Effort: Pulmonary effort is normal.     Breath sounds: Normal breath sounds.  Abdominal:     Palpations: Abdomen is soft.     Tenderness: There is no abdominal tenderness.  Neurological:     General: No focal deficit present.     Mental Status: He is alert and oriented to person, place, and time.         Assessment & Plan:   1. Essential hypertension Continue. Meds refilled.  - amLODipine (NORVASC) 10 MG tablet; TAKE 1 TABLET(10 MG) BY MOUTH DAILY  Dispense: 90 tablet; Refill: 1 - hydrochlorothiazide (HYDRODIURIL) 25 MG tablet; TAKE 1 TABLET(25 MG) BY MOUTH DAILY   Dispense: 90 tablet; Refill: 1  2. Elevated lipoprotein(a) Continue. Meds refilled  3. Screening for colon cancer  - Ambulatory referral to Gastroenterology    Return in about 6 months (around 10/06/2022) for physical.   Francis Raymond, MD

## 2022-05-05 ENCOUNTER — Ambulatory Visit: Payer: 59 | Admitting: Orthopaedic Surgery

## 2022-05-13 ENCOUNTER — Ambulatory Visit (INDEPENDENT_AMBULATORY_CARE_PROVIDER_SITE_OTHER): Payer: No Typology Code available for payment source | Admitting: Orthopaedic Surgery

## 2022-05-13 ENCOUNTER — Encounter: Payer: Self-pay | Admitting: Orthopaedic Surgery

## 2022-05-13 VITALS — BP 148/90 | HR 88 | Ht 64.0 in | Wt 210.0 lb

## 2022-05-13 DIAGNOSIS — S46012D Strain of muscle(s) and tendon(s) of the rotator cuff of left shoulder, subsequent encounter: Secondary | ICD-10-CM

## 2022-05-13 DIAGNOSIS — M75122 Complete rotator cuff tear or rupture of left shoulder, not specified as traumatic: Secondary | ICD-10-CM | POA: Insufficient documentation

## 2022-05-13 NOTE — Progress Notes (Signed)
Office Visit Note   Patient: Francis Coleman           Date of Birth: March 30, 1961           MRN: [id number] Visit Date: 05/13/2022              Requested by: Dorna Mai, [address] [place],  [place] [address] PCP: Dorna Mai, MD   Assessment & Plan: Visit Diagnoses:  1. Traumatic complete tear of left rotator cuff, subsequent encounter           ( Near complete tear supraspinatus )  Plan: Work slip given for continued light work as he has been doing pending left shoulder rotator cuff repair.  We discussed setting this up as an outpatient.  He does not have any rotator cuff atrophy of the supraspinatus.  The tear has minimal retraction is 80% torn through the anterior portion of the tendon and I discussed with him is unlikely to have improvement without surgical repair.  He does have some changes in his cervical spine but none of these require surgery at this point.  I discussed with him and his rehab nurse that eventually at some point he might need something done to his cervical spine but at this point is not related to his on-the-job injury and that discogenic changes at the endplate at C5-6 may resolve with time.  We discussed postoperative sling, preoperative interscalene block, general anesthesia postop sling.  They need to have someone available to help him with meals laundry etc. after the surgery from his family.  Questions were elicited and answered.  Decision for surgery made.  Percocet given for continued light work pending scheduling and surgery on the left shoulder.  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  Follow-Up Instructions: No follow-ups on file.   Orders:  No orders of the  defined types were placed in this encounter.  No orders of the defined types were placed in this encounter.     Procedures: No procedures performed   Clinical Data: No additional findings.   Subjective: Chief Complaint  Patient presents with   Left Shoulder - Follow-up   Neck - Follow-up   Lower Back - Follow-up    HPI 62 year old male returns for follow-up of on-the-job injury 12/31/2021.  He has been on light duty since his injury.  Patient's had recent MRI scan cervical spine which does show some disc bulging at C3-4 and also C6-7 with only mild stenosis.  Discogenic changes at C5-6 in the endplate is also noted and may be responsible for some of his neck pain.  In any event it does not show severe compression or moderate to severe compression on the left side corresponding with the pain that he has in his left shoulder with abduction and with activities.  MRI scan of his shoulder previously showed near complete tear of supraspinatus at the insertion site without atrophy of the muscle.  Patient has been healthy otherwise he does have some hypertension hyperlipidemia.  No other significant medical history.  Patient rehab nurse Golden Pop RN, CCM is present today her fax number is [phone].  MRI results plain radiographs  MRI of the shoulder MRI of the neck was discussed with her and outlined treatment plan today.  Review of Systems all other systems updated unchanged from 01/28/2022 office visit.   Objective: Vital Signs: BP (!) 148/90   Pulse 88   Ht 5\' 4"  (1.626 m)   Wt 210 lb (95.3 kg)   BMI 36.05 kg/m   Physical Exam Constitutional:      Appearance: He is well-developed.  HENT:     Head: Normocephalic and atraumatic.     Right Ear: External ear normal.     Left Ear: External ear normal.  Eyes:     Pupils: Pupils are equal, round, and reactive to light.  Neck:     Thyroid: No thyromegaly.     Trachea: No tracheal deviation.  Cardiovascular:     Rate and  Rhythm: Normal rate.  Pulmonary:     Effort: Pulmonary effort is normal.     Breath sounds: No wheezing.  Abdominal:     General: Bowel sounds are normal.     Palpations: Abdomen is soft.  Musculoskeletal:     Cervical back: Neck supple.  Skin:    General: Skin is warm and dry.     Capillary Refill: Capillary refill takes less than 2 seconds.  Neurological:     Mental Status: He is alert and oriented to person, place, and time.  Psychiatric:        Behavior: Behavior normal.        Thought Content: Thought content normal.        Judgment: Judgment normal.     Ortho Exam positive drop arm test on the left positive impingement.  Reproduction of pain with resisted supraspinatus testing.  Negative liftoff test subscap external rotation is strong.  Biceps is normal no tenderness of the long head of the biceps tendon.  Specialty Comments:  No specialty comments available.  Imaging: Cervical MRI impression showed some central disc protrusion at C3-4 mild stenosis.  Mild left C5 and right C6 foraminal stenosis related to disc osteophyte and facet disease.  Discogenic reactive changes at C5-6 that could contribute to underlying neck pain.  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      PMFS History: Patient Active Problem List   Diagnosis Date Noted   Complete tear of left rotator cuff 05/13/2022   Other spondylosis with radiculopathy, cervical region 01/28/2022   Impingement syndrome of left shoulder 01/28/2022   OSA (obstructive sleep apnea) 01/25/2020   Fatigue 01/11/2020   Essential hypertension 11/09/2019   Hyperlipidemia 11/09/2019   Erectile dysfunction 09/14/2019   Past Medical History:  Diagnosis Date   GERD (gastroesophageal reflux disease)    past hx. -none recent-controls with diet.   Hypertension    S/P epidural steroid injection    due to fall and" herniated disc"   Sleep apnea    no use since 2010    History reviewed. No pertinent family  history.  Past Surgical History:  Procedure Laterality Date   CIRCUMCISION     COLON SURGERY N/A    [facility] 02/12/2020   COLONOSCOPY WITH PROPOFOL N/A 09/03/2014   Procedure: COLONOSCOPY WITH PROPOFOL;  Surgeon: Garlan Fair, MD;  Location: [facility];  Service: Endoscopy;  Laterality: N/A;   Social History   Occupational History   Not on file  Tobacco Use   Smoking status: Former    Packs/day: 0.25    Years: 2.00    Total pack years: 0.50  Types: Cigarettes   Smokeless tobacco: Former    Quit date: 08/23/1997  Vaping Use   Vaping Use: Never used  Substance and Sexual Activity   Alcohol use: Yes    Comment: socially   Drug use: No   Sexual activity: Not Currently

## 2022-05-19 ENCOUNTER — Telehealth: Payer: Self-pay | Admitting: Orthopaedic Surgery

## 2022-05-19 NOTE — Telephone Encounter (Signed)
05/13/22 ov note & work note faxed to Work Futures trader, [employer] [phone]

## 2022-05-27 ENCOUNTER — Telehealth: Payer: Self-pay

## 2022-05-27 NOTE — Telephone Encounter (Signed)
Spoke with patient and nurse case manager to confirm surgery information and post op appointment.  Nurse is asking if patient will need post op P.T.  If so, how soon will that start?  Let me know and I can email her the answer.  If it's immediately after surgery, please provide Rx so I can forward to her.  Thanks!

## 2022-05-27 NOTE — Telephone Encounter (Signed)
In his note, he states that patient will be in the sling x 6 weeks and then PT would start. Is she needing something other than what he put in note?

## 2022-05-28 NOTE — Telephone Encounter (Signed)
Emailed this information to Golden Pop, RN case Freight forwarder.

## 2022-06-05 ENCOUNTER — Ambulatory Visit: Payer: 59 | Admitting: Family Medicine

## 2022-06-15 ENCOUNTER — Other Ambulatory Visit: Payer: Self-pay | Admitting: Orthopaedic Surgery

## 2022-06-15 DIAGNOSIS — M24112 Other articular cartilage disorders, left shoulder: Secondary | ICD-10-CM | POA: Diagnosis not present

## 2022-06-15 DIAGNOSIS — M75112 Incomplete rotator cuff tear or rupture of left shoulder, not specified as traumatic: Secondary | ICD-10-CM | POA: Diagnosis not present

## 2022-06-15 MED ORDER — OXYCODONE-ACETAMINOPHEN 5-325 MG PO TABS: 1.0000 | ORAL_TABLET | Freq: Four times a day (QID) | ORAL | 0 refills | Status: DC | PRN

## 2022-06-23 ENCOUNTER — Encounter: Payer: Self-pay | Admitting: Orthopaedic Surgery

## 2022-06-23 ENCOUNTER — Ambulatory Visit (INDEPENDENT_AMBULATORY_CARE_PROVIDER_SITE_OTHER): Payer: No Typology Code available for payment source | Admitting: Orthopaedic Surgery

## 2022-06-23 VITALS — BP 142/79 | HR 86 | Ht 64.0 in | Wt 210.0 lb

## 2022-06-23 DIAGNOSIS — S46012D Strain of muscle(s) and tendon(s) of the rotator cuff of left shoulder, subsequent encounter: Secondary | ICD-10-CM

## 2022-06-23 NOTE — Progress Notes (Signed)
   Post-Op Visit Note   Patient: Francis Coleman           Date of Birth: 08/07/1960           MRN: [id number] Visit Date: 06/23/2022 PCP: Dorna Mai, MD   Assessment & Plan: Follow-up left shoulder rotator cuff repair sutures removed.  He had partial tear with 10% bursal and articular surface still intact.  Repaired with a single anchor small with supraspinatus tear.  Rest the rotator cuff biceps tendon was normal.  Mild trimming of the superior labrum was performed but it did not require repair.  Will check him back again in 3 weeks.  Arthroscopic portal sutures are removed.  Will start therapy in 3 weeks.  Case manager was present with him today Golden Pop fax number [phone].  Work slip given no work x 8 weeks.  Chief Complaint:  Chief Complaint  Patient presents with   Left Shoulder - Routine Post Op    06/15/2022 left shoulder scope, RCR   Visit Diagnoses:  1. Traumatic complete tear of left rotator cuff, subsequent encounter     Plan: Return in 3 weeks.  Follow-Up Instructions: Return in about 3 weeks (around 07/14/2022).   Orders:  No orders of the defined types were placed in this encounter.  No orders of the defined types were placed in this encounter.   Imaging: No results found.  PMFS History: Patient Active Problem List   Diagnosis Date Noted   Complete tear of left rotator cuff 05/13/2022   Other spondylosis with radiculopathy, cervical region 01/28/2022   Impingement syndrome of left shoulder 01/28/2022   OSA (obstructive sleep apnea) 01/25/2020   Fatigue 01/11/2020   Essential hypertension 11/09/2019   Hyperlipidemia 11/09/2019   Erectile dysfunction 09/14/2019   Past Medical History:  Diagnosis Date   GERD (gastroesophageal reflux disease)    past hx. -none recent-controls with diet.   Hypertension    S/P epidural steroid injection    due to fall and" herniated disc"   Sleep apnea    no use since 2010    No family history on file.   Past Surgical History:  Procedure Laterality Date   CIRCUMCISION     COLON SURGERY N/A    [facility] 02/12/2020   COLONOSCOPY WITH PROPOFOL N/A 09/03/2014   Procedure: COLONOSCOPY WITH PROPOFOL;  Surgeon: Garlan Fair, MD;  Location: [facility];  Service: Endoscopy;  Laterality: N/A;   Social History   Occupational History   Not on file  Tobacco Use   Smoking status: Former    Packs/day: 0.25    Years: 2.00    Total pack years: 0.50    Types: Cigarettes   Smokeless tobacco: Former    Quit date: 08/23/1997  Vaping Use   Vaping Use: Never used  Substance and Sexual Activity   Alcohol use: Yes    Comment: socially   Drug use: No   Sexual activity: Not Currently

## 2022-06-25 ENCOUNTER — Encounter: Payer: Self-pay | Admitting: Internal Medicine

## 2022-07-10 ENCOUNTER — Telehealth: Payer: Self-pay

## 2022-07-10 NOTE — Telephone Encounter (Signed)
Multiple attempts made to reach patient; unable to reach patient;  left message for patient to call back to the office prior to 5 pm; If patient fails to call back to the office prior to 5 pm, a no show letter will be sent and the PV and procedure appts will be cancelled;

## 2022-07-14 ENCOUNTER — Ambulatory Visit (INDEPENDENT_AMBULATORY_CARE_PROVIDER_SITE_OTHER): Payer: No Typology Code available for payment source | Admitting: Orthopaedic Surgery

## 2022-07-14 ENCOUNTER — Encounter: Payer: Self-pay | Admitting: Orthopaedic Surgery

## 2022-07-14 VITALS — BP 127/78 | Ht 64.0 in | Wt 210.0 lb

## 2022-07-14 DIAGNOSIS — S46012D Strain of muscle(s) and tendon(s) of the rotator cuff of left shoulder, subsequent encounter: Secondary | ICD-10-CM

## 2022-07-14 MED ORDER — TRAMADOL HCL 50 MG PO TABS: 50.0000 mg | ORAL_TABLET | Freq: Four times a day (QID) | ORAL | 0 refills | Status: AC | PRN

## 2022-07-14 NOTE — Progress Notes (Signed)
   Post-Op Visit Note   Patient: Francis Coleman           Date of Birth: [birth year]           MRN: [id number] Visit Date: 07/14/2022 PCP: Dorna Mai, MD   Assessment & Plan: Patient returns for follow-up of a small rotator cuff repair he had a interstitial tear and the surface was intact.  He was fixed with 1 anchor.  Of some mild labral trimming was performed.  Last visit he was given a note to keep him out for 8 weeks.  Will start some physical therapy upstairs in the physical therapy department.  Prescription given for some Ultram that he can use for soreness if needed.  He can continue to take some anti-inflammatories if needed.  Chief Complaint:  Chief Complaint  Patient presents with   Left Shoulder - Follow-up, Routine Post Op    06/15/2022 left shoulder scope, RCR   Visit Diagnoses: No diagnosis found.  Plan: Patient is ready start some physical therapy.  He will begin with passive range of motion and active assistive range of motion.  At 6 weeks after surgery can start some light resistive supraspinatus exercises under the supervision of the physical therapist.  Recheck 4 weeks.  Amparo Bristol, RN, CCM was present for today's visit fax [phone]  Follow-Up Instructions: No follow-ups on file.   Orders:  No orders of the defined types were placed in this encounter.  No orders of the defined types were placed in this encounter.   Imaging: No results found.  PMFS History: Patient Active Problem List   Diagnosis Date Noted   Complete tear of left rotator cuff 05/13/2022   Other spondylosis with radiculopathy, cervical region 01/28/2022   Impingement syndrome of left shoulder 01/28/2022   OSA (obstructive sleep apnea) 01/25/2020   Fatigue 01/11/2020   Essential hypertension 11/09/2019   Hyperlipidemia 11/09/2019   Erectile dysfunction 09/14/2019   Past Medical History:  Diagnosis Date   GERD (gastroesophageal reflux disease)    past hx. -none recent-controls  with diet.   Hypertension    S/P epidural steroid injection    due to fall and" herniated disc"   Sleep apnea    no use since 2010    No family history on file.  Past Surgical History:  Procedure Laterality Date   CIRCUMCISION     COLON SURGERY N/A    [facility] 02/12/2020   COLONOSCOPY WITH PROPOFOL N/A 09/03/2014   Procedure: COLONOSCOPY WITH PROPOFOL;  Surgeon: Garlan Fair, MD;  Location: [facility];  Service: Endoscopy;  Laterality: N/A;   Social History   Occupational History   Not on file  Tobacco Use   Smoking status: Former    Packs/day: 0.25    Years: 2.00    Additional pack years: 0.00    Total pack years: 0.50    Types: Cigarettes   Smokeless tobacco: Former    Quit date: 08/23/1997  Vaping Use   Vaping Use: Never used  Substance and Sexual Activity   Alcohol use: Yes    Comment: socially   Drug use: No   Sexual activity: Not Currently

## 2022-07-15 NOTE — Addendum Note (Signed)
Addended by: Meyer Cory on: 07/15/2022 10:03 AM   Modules accepted: Orders

## 2022-07-31 NOTE — Telephone Encounter (Signed)
Patient called and questions were addressed with patient.   Copied from CRM [id number]. Topic: General - Other >> Jul 31, 2022  1:37 PM Francis Coleman wrote: Reason for CRM: Patient called wanted to know if he plug his CPAP machine up with the new face mask and hose he recvd will it still be recording.

## 2022-08-03 ENCOUNTER — Encounter: Payer: 59 | Admitting: Internal Medicine

## 2022-08-14 ENCOUNTER — Ambulatory Visit (INDEPENDENT_AMBULATORY_CARE_PROVIDER_SITE_OTHER): Payer: No Typology Code available for payment source | Admitting: Orthopaedic Surgery

## 2022-08-14 ENCOUNTER — Ambulatory Visit: Payer: Self-pay

## 2022-08-14 ENCOUNTER — Ambulatory Visit (AMBULATORY_SURGERY_CENTER): Payer: Self-pay | Admitting: *Deleted

## 2022-08-14 ENCOUNTER — Encounter: Payer: Self-pay | Admitting: Orthopaedic Surgery

## 2022-08-14 VITALS — Ht 64.0 in | Wt 182.0 lb

## 2022-08-14 VITALS — BP 139/79 | HR 88 | Ht 64.0 in | Wt 182.0 lb

## 2022-08-14 DIAGNOSIS — S46012D Strain of muscle(s) and tendon(s) of the rotator cuff of left shoulder, subsequent encounter: Secondary | ICD-10-CM

## 2022-08-14 DIAGNOSIS — Z1211 Encounter for screening for malignant neoplasm of colon: Secondary | ICD-10-CM

## 2022-08-14 MED ORDER — NA SULFATE-K SULFATE-MG SULF 17.5-3.13-1.6 GM/177ML PO SOLN
1.0000 | Freq: Once | ORAL | 0 refills | Status: AC
Start: 2022-08-14 — End: 2022-08-14

## 2022-08-14 NOTE — Telephone Encounter (Signed)
Pt is unsure if he is supposed to continue taking medications hydrochlorothiazide (HYDRODIURIL) 25 MG tablet, amLODipine (NORVASC) 10 MG tablet, and atorvastatin (LIPITOR) 40 MG tablet. He stated that sometimes he feels very tired when taking the medication, so he is unsure if he should be taking these medications.   Left message to call back.

## 2022-08-14 NOTE — Progress Notes (Signed)
Pt's name and DOB verified at the beginning of the pre-visit.  Pt denies any difficulty with ambulating.  No egg or soy allergy known to patient  No issues known to pt with past sedation with any surgeries or procedures Pt denies having issues being intubated Pt has no issues moving head neck or swallowing No FH of Malignant Hyperthermia Pt is not on diet pills Pt is not on home 02  Pt is not on blood thinners  Pt denies issues with constipation  Pt is not on dialysis Pt denies any upcoming cardiac testing Pt encouraged to use to use Singlecare or Goodrx to reduce cost  Patient's chart reviewed by Cathlyn Parsons CNRA prior to pre-visit and patient appropriate for the [facility].  Pre-visit completed and red dot placed by patient's name on their procedure day (on provider's schedule).  . Visit by phone Pt states weight is 182 lb Instructions reviewed with pt and pt states understanding. Instructed to review again prior to procedure. Pt states they will.  Instructions sent by mail with coupon and by my chart

## 2022-08-14 NOTE — Progress Notes (Unsigned)
Post-Op Visit Note   Patient: Francis Coleman           Date of Birth: January 25, 1961           MRN: [id number] Visit Date: 08/14/2022 PCP: Georganna Skeans, MD   Assessment & Plan: 2 months post rotator cuff repair exactly left shoulder is fixed with a single anchor.  Arthroscopic portals look good.  Incisions well-healed.  He has been doing some therapy still had his shoulder immobilizer on and he can discontinue his sling and shoulder immobilizer.  He is now past 6 weeks out and needs to be working on active assistive range of motion with a pulley.  I discussed with him he could get a pulley save the receipt turn that into Work[employer]'s [employer].Marland Kitchen  He can use stick or broom lay down using both hands on it and gradually work on getting his arms up over his head.  Sharrie Rothman was present today fax #[phone]. prescription written [facility] but says get active assistive range of motion going on left shoulder pulley, stick laying down over his head etc.  Discontinue sling.  Recheck 3 weeks.  Work slip given no work x 4 weeks.  I discussed with them that he can use a pulley principally to get his motion going and then can begin doing some light resistive exercises if the can in his hand. Chief Complaint:  Chief Complaint  Patient presents with   Left Shoulder - Routine Post Op, Follow-up    06/15/2022 Left shoulder arthroscopy, RCTR   Visit Diagnoses:  1. Traumatic complete tear of left rotator cuff, subsequent encounter     Plan: Work slip given no work x 4 weeks recheck 3 weeks.  Follow-Up Instructions: No follow-ups on file.   Orders:  No orders of the defined types were placed in this encounter.  No orders of the defined types were placed in this encounter.   Imaging: No results found.  PMFS History: Patient Active Problem List   Diagnosis Date Noted   Complete tear of left rotator cuff 05/13/2022   Other spondylosis with radiculopathy, cervical  region 01/28/2022   Impingement syndrome of left shoulder 01/28/2022   OSA (obstructive sleep apnea) 01/25/2020   Fatigue 01/11/2020   Essential hypertension 11/09/2019   Hyperlipidemia 11/09/2019   Erectile dysfunction 09/14/2019   Past Medical History:  Diagnosis Date   GERD (gastroesophageal reflux disease)    past hx. -none recent-controls with diet.   Hypertension    S/P epidural steroid injection    due to fall and" herniated disc"   Sleep apnea    no use since 2010    Family History  Problem Relation Age of Onset   Colon cancer Neg Hx    Colon polyps Neg Hx    Esophageal cancer Neg Hx    Stomach cancer Neg Hx    Rectal cancer Neg Hx     Past Surgical History:  Procedure Laterality Date   CIRCUMCISION     COLON SURGERY N/A    [facility] 02/12/2020   COLONOSCOPY WITH PROPOFOL N/A 09/03/2014   Procedure: COLONOSCOPY WITH PROPOFOL;  Surgeon: Charolett Bumpers, MD;  Location: [facility];  Service: Endoscopy;  Laterality: N/A;   Social History   Occupational History   Not on file  Tobacco Use   Smoking status: Former    Packs/day: 0.25    Years: 2.00    Additional pack years: 0.00    Total  pack years: 0.50    Types: Cigarettes    Quit date: 22    Years since quitting: 25.3   Smokeless tobacco: Never  Vaping Use   Vaping Use: Never used  Substance and Sexual Activity   Alcohol use: Yes    Comment: socially   Drug use: No   Sexual activity: Not Currently

## 2022-08-14 NOTE — Telephone Encounter (Signed)
  Chief Complaint: Weakness Symptoms: about 5-6 in the evening pt is fatigued Frequency: 9 months Pertinent Negatives: Patient denies Any other s/s Disposition: ED /[] Urgent Care (no appt availability in office) / Appointment(In office/virtual)/  [facility]/ Home Care/ Refused Recommended Disposition /[] [facility]/  Follow-up with PCP Additional Notes: PT states that he has fatigue everyday between 5-6 pm. PT states that this has been happening for 9 months. PT is unsure if this is d/t medications, not drinking enough water, needing vitamins or just getting older.    Summary: med management   Pt is unsure if he is supposed to continue taking medications hydrochlorothiazide (HYDRODIURIL) 25 MG tablet, amLODipine (NORVASC) 10 MG tablet, and atorvastatin (LIPITOR) 40 MG tablet. He stated that sometimes he feels very tired when taking the medication, so he is unsure if he should be taking these medications.  Pt seeking clinical advice.     Reason for Disposition  Weakness is a chronic symptom (recurrent or ongoing AND present > 4 weeks)  Answer Assessment - Initial Assessment Questions 1. DESCRIPTION: "Describe how you are feeling."     At 5-6 pm pt feels very drained. During the day pt feels fine. 2. SEVERITY: "How bad is it?"  "Can you stand and walk?"   - MILD (0-3): Feels weak or tired, but does not interfere with work, school or normal activities.   - MODERATE (4-7): Able to stand and walk; weakness interferes with work, school, or normal activities.   - SEVERE (8-10): Unable to stand or walk; unable to do usual activities.     mild 3. ONSET: "When did these symptoms begin?" (e.g., hours, days, weeks, months)     8-9 months 4. CAUSE: "What do you think is causing the weakness or fatigue?" (e.g., not drinking enough fluids, medical problem, trouble sleeping)     Not drinking enough water, maybe needs vitamins 5. NEW MEDICINES:  "Have you started on  any new medicines recently?" (e.g., opioid pain medicines, benzodiazepines, muscle relaxants, antidepressants, antihistamines, neuroleptics, beta blockers)     no 6. OTHER SYMPTOMS: "Do you have any other symptoms?" (e.g., chest pain, fever, cough, SOB, vomiting, diarrhea, bleeding, other areas of pain)     no  Protocols used: Weakness (Generalized) and Fatigue-A-AH

## 2022-08-17 ENCOUNTER — Ambulatory Visit: Payer: 59 | Admitting: Family Medicine

## 2022-08-28 ENCOUNTER — Telehealth: Payer: Self-pay | Admitting: Internal Medicine

## 2022-08-28 DIAGNOSIS — Z1211 Encounter for screening for malignant neoplasm of colon: Secondary | ICD-10-CM

## 2022-08-28 MED ORDER — NA SULFATE-K SULFATE-MG SULF 17.5-3.13-1.6 GM/177ML PO SOLN
1.0000 | Freq: Once | ORAL | 0 refills | Status: AC
Start: 2022-08-28 — End: 2022-08-28

## 2022-08-28 NOTE — Telephone Encounter (Signed)
Verify pharmacy with pt. Suprep sent into [facility] on [address] and [address].

## 2022-08-28 NOTE — Telephone Encounter (Signed)
Inbound call from patient , he is having a procedure on 5/15 and when he went to pick up medications , patient stated pharmacy told him that they do not have it and someone from our clinic will have to get in touch with them.. Please advise.

## 2022-09-08 ENCOUNTER — Ambulatory Visit (INDEPENDENT_AMBULATORY_CARE_PROVIDER_SITE_OTHER): Payer: No Typology Code available for payment source | Admitting: Orthopaedic Surgery

## 2022-09-08 DIAGNOSIS — S46012S Strain of muscle(s) and tendon(s) of the rotator cuff of left shoulder, sequela: Secondary | ICD-10-CM

## 2022-09-08 NOTE — Progress Notes (Signed)
Office Visit Note   Patient: Francis Coleman           Date of Birth: 1960/08/28           MRN: [id number] Visit Date: 09/08/2022              Requested by: Georganna Skeans, MD [address] [place],  [place] [address] PCP: Georganna Skeans, MD   Assessment & Plan: Visit Diagnoses:  1. 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  Some straightening of the cervical spine and endplate edema with disc degeneration without compression at C5-6.  Small bulge at C3-4. Patient will continue use of the pulley.  He can resume regular work on 09/28/2022 without restrictions.  I plan to recheck him in 6 weeks and we will see if he is ready to have an impairment rating assigned in line with his rotator cuff repair.  Discussed with him he does anything else done for his neck at this time is got a little bit of arthritis as he had present before his on-the-job injury.  Rehab nurse was present and included in the discussion today.  Follow-Up Instructions: Return in about 6 weeks (around 10/20/2022).   Orders:  No orders of the defined types were placed in this encounter.  No orders of the defined types were placed in this encounter.     Procedures: No procedures performed   Clinical Data: No additional findings.   Subjective: Chief Complaint  Patient presents with   Left Shoulder - Routine Post Op, Follow-up    HPI follow Worker's Comp. on-the-job injury left shoulder arthroscopy rotator cuff repair with a single anchor.  He can get his arm up over his head he is using a 1 pound weight with therapy.  As a pulley he is using at home as we discussed.  Work slip given no work until 09/28/2022 and he can begin regular work on 09/28/2022 without restrictions.  Review of Systems updated unchanged   Objective: Vital Signs: There were no vitals taken for this visit.  Physical  Exam Constitutional:      Appearance: He is well-developed.  HENT:     Head: Normocephalic and atraumatic.     Right Ear: External ear normal.     Left Ear: External ear normal.  Eyes:     Pupils: Pupils are equal, round, and reactive to light.  Neck:     Thyroid: No thyromegaly.     Trachea: No tracheal deviation.  Cardiovascular:     Rate and Rhythm: Normal rate.  Pulmonary:     Effort: Pulmonary effort is normal.     Breath sounds: No wheezing.  Abdominal:     General: Bowel sounds are normal.     Palpations: Abdomen is soft.  Musculoskeletal:     Cervical back: Neck supple.  Skin:    General: Skin is warm and dry.     Capillary Refill: Capillary refill takes less than 2 seconds.  Neurological:     Mental Status: He is alert and oriented to person, place, and time.  Psychiatric:        Behavior: Behavior normal.        Thought Content: Thought content normal.        Judgment: Judgment normal.     Ortho Exam arthroscopic portals well-healed he can get his arm slowly up although weight  over his head without limitation of flexion or abduction.  Specialty Comments:  No specialty comments available.  Imaging: No results found.   PMFS History: Patient Active Problem List   Diagnosis Date Noted   Complete tear of left rotator cuff 05/13/2022   Other spondylosis with radiculopathy, cervical region 01/28/2022   Impingement syndrome of left shoulder 01/28/2022   OSA (obstructive sleep apnea) 01/25/2020   Fatigue 01/11/2020   Essential hypertension 11/09/2019   Hyperlipidemia 11/09/2019   Erectile dysfunction 09/14/2019   Past Medical History:  Diagnosis Date   GERD (gastroesophageal reflux disease)    past hx. -none recent-controls with diet.   Hypertension    S/P epidural steroid injection    due to fall and" herniated disc"   Sleep apnea    no use since 2010    Family History  Problem Relation Age of Onset   Colon cancer Neg Hx    Colon polyps Neg Hx     Esophageal cancer Neg Hx    Stomach cancer Neg Hx    Rectal cancer Neg Hx     Past Surgical History:  Procedure Laterality Date   CIRCUMCISION     COLON SURGERY N/A    [facility]reesia 02/12/2020   COLONOSCOPY WITH PROPOFOL N/A 09/03/2014   Procedure: COLONOSCOPY WITH PROPOFOL;  Surgeon: Charolett Bumpers, MD;  Location: [facility];  Service: Endoscopy;  Laterality: N/A;   Social History   Occupational History   Not on file  Tobacco Use   Smoking status: Former    Packs/day: 0.25    Years: 2.00    Additional pack years: 0.00    Total pack years: 0.50    Types: Cigarettes    Quit date: 1999    Years since quitting: 25.3   Smokeless tobacco: Never  Vaping Use   Vaping Use: Never used  Substance and Sexual Activity   Alcohol use: Yes    Comment: socially   Drug use: No   Sexual activity: Not Currently

## 2022-09-09 ENCOUNTER — Encounter: Payer: Self-pay | Admitting: Internal Medicine

## 2022-09-09 ENCOUNTER — Ambulatory Visit (AMBULATORY_SURGERY_CENTER): Payer: Self-pay | Admitting: Internal Medicine

## 2022-09-09 VITALS — BP 102/72 | HR 67 | Temp 98.6°F | Resp 13 | Ht 64.0 in | Wt 182.0 lb

## 2022-09-09 DIAGNOSIS — D125 Benign neoplasm of sigmoid colon: Secondary | ICD-10-CM

## 2022-09-09 DIAGNOSIS — Z1211 Encounter for screening for malignant neoplasm of colon: Secondary | ICD-10-CM

## 2022-09-09 MED ORDER — SODIUM CHLORIDE 0.9 % IV SOLN
500.0000 mL | Freq: Once | INTRAVENOUS | Status: DC
Start: 2022-09-09 — End: 2022-09-09

## 2022-09-09 NOTE — Progress Notes (Signed)
Called to room to assist during endoscopic procedure.  Patient ID and intended procedure confirmed with present staff. Received instructions for my participation in the procedure from the performing physician.  

## 2022-09-09 NOTE — Op Note (Signed)
[facility] Patient Name: Francis Coleman Procedure Date: 09/09/2022 11:04 AM MRN: [id number] Endoscopist: Wilhemina Bonito. Marina Goodell , MD, [id number] Age: 62 Referring MD:  Date of Birth: 02-Jun-1960 Gender: Male Account #: [id number] Procedure:                Colonoscopy with cold snare polypectomy x 1 Indications:              Screening for colorectal malignant neoplasm Medicines:                Monitored Anesthesia Care Procedure:                Pre-Anesthesia Assessment:                           - Prior to the procedure, a History and Physical                            was performed, and patient medications and                            allergies were reviewed. The patient's tolerance of                            previous anesthesia was also reviewed. The risks                            and benefits of the procedure and the sedation                            options and risks were discussed with the patient.                            All questions were answered, and informed consent                            was obtained. Prior Anticoagulants: The patient has                            taken no anticoagulant or antiplatelet agents. ASA                            Grade Assessment: II - A patient with mild systemic                            disease. After reviewing the risks and benefits,                            the patient was deemed in satisfactory condition to                            undergo the procedure.                           After obtaining informed consent, the colonoscope  was passed under direct vision. Throughout the                            procedure, the patient's blood pressure, pulse, and                            oxygen saturations were monitored continuously. The                            Olympus CF-HQ190L ([id number]) Colonoscope was                            introduced through the anus and advanced to the the                             cecum, identified by appendiceal orifice and                            ileocecal valve. The ileocecal valve, appendiceal                            orifice, and rectum were photographed. The quality                            of the bowel preparation was excellent. The                            colonoscopy was performed without difficulty. The                            patient tolerated the procedure well. The bowel                            preparation used was SUPREP via split dose                            instruction. Scope In: 11:15:17 AM Scope Out: 11:23:32 AM Scope Withdrawal Time: 0 hours 6 minutes 51 seconds  Total Procedure Duration: 0 hours 8 minutes 15 seconds  Findings:                 A 6 mm polyp was found in the sigmoid colon. The                            polyp was pedunculated. The polyp was removed with                            a cold snare. Resection and retrieval were complete.                           The exam was otherwise without abnormality on                            direct and retroflexion views. Complications:  No immediate complications. Estimated blood loss:                            None. Estimated Blood Loss:     Estimated blood loss: none. Impression:               - One 6 mm polyp in the sigmoid colon, removed with                            a cold snare. Resected and retrieved.                           - The examination was otherwise normal on direct                            and retroflexion views. Recommendation:           - Repeat colonoscopy in 7 years for surveillance.                           - Patient has a contact number available for                            emergencies. The signs and symptoms of potential                            delayed complications were discussed with the                            patient. Return to normal activities tomorrow.                            Written discharge  instructions were provided to the                            patient.                           - Resume previous diet.                           - Continue present medications.                           - Await pathology results. Wilhemina Bonito. Marina Goodell, MD 09/09/2022 11:29:44 AM This report has been signed electronically.

## 2022-09-09 NOTE — Progress Notes (Signed)
Pt's states no medical or surgical changes since previsit or office visit. 

## 2022-09-09 NOTE — Patient Instructions (Addendum)
1 polyp removed and sent to pathology.                                                   - Repeat colonoscopy in 7 years for surveillance.                           - Resume previous diet.                           - Continue present medications.                           - Await pathology results.  YOU HAD AN ENDOSCOPIC PROCEDURE TODAY AT THE [facility]:   Refer to the procedure report that was given to you for any specific questions about what was found during the examination.  If the procedure report does not answer your questions, please call your gastroenterologist to clarify.  If you requested that your care partner not be given the details of your procedure findings, then the procedure report has been included in a sealed envelope for you to review at your convenience later.  YOU SHOULD EXPECT: Some feelings of bloating in the abdomen. Passage of more gas than usual.  Walking can help get rid of the air that was put into your GI tract during the procedure and reduce the bloating. If you had a lower endoscopy (such as a colonoscopy or flexible sigmoidoscopy) you may notice spotting of blood in your stool or on the toilet paper. If you underwent a bowel prep for your procedure, you may not have a normal bowel movement for a few days.  Please Note:  You might notice some irritation and congestion in your nose or some drainage.  This is from the oxygen used during your procedure.  There is no need for concern and it should clear up in a day or so.  SYMPTOMS TO REPORT IMMEDIATELY:  Following lower endoscopy (colonoscopy or flexible sigmoidoscopy):  Excessive amounts of blood in the stool  Significant tenderness or worsening of abdominal pains  Swelling of the abdomen that is new, acute  Fever of 100F or higher   For urgent or emergent issues, a gastroenterologist can be reached at any hour by calling [phone]. Do not use MyChart messaging for urgent concerns.     DIET:  We do recommend a small meal at first, but then you may proceed to your regular diet.  Drink plenty of fluids but you should avoid alcoholic beverages for 24 hours.  ACTIVITY:  You should plan to take it easy for the rest of today and you should NOT DRIVE or use heavy machinery until tomorrow (because of the sedation medicines used during the test).    FOLLOW UP: Our staff will call the number listed on your records the next business day following your procedure.  We will call around 7:15- 8:00 am to check on you and address any questions or concerns that you may have regarding the information given to you following your procedure. If we do not reach you, we will leave a message.     If any biopsies were taken you will be contacted by phone or by letter within  the next 1-3 weeks.  Please call us at [phone] if you have not heard about the biopsies in 3 weeks.    SIGNATURES/CONFIDENTIALITY: You and/or your care partner have signed paperwork which will be entered into your electronic medical record.  These signatures attest to the fact that that the information above on your After Visit Summary has been reviewed and is understood.  Full responsibility of the confidentiality of this discharge information lies with you and/or your care-partner.

## 2022-09-09 NOTE — Progress Notes (Signed)
HISTORY OF PRESENT ILLNESS:  Francis Coleman is a 62 y.o. male who presents today for screening colonoscopy.  No complaints.  REVIEW OF SYSTEMS:  All non-GI ROS negative except for  Past Medical History:  Diagnosis Date   GERD (gastroesophageal reflux disease)    past hx. -none recent-controls with diet.   Hypertension    S/P epidural steroid injection    due to fall and" herniated disc"   Sleep apnea    no use since 2010    Past Surgical History:  Procedure Laterality Date   CIRCUMCISION     COLON SURGERY N/A    [facility]reesia 02/12/2020   COLONOSCOPY WITH PROPOFOL N/A 09/03/2014   Procedure: COLONOSCOPY WITH PROPOFOL;  Surgeon: Charolett Bumpers, MD;  Location: [facility];  Service: Endoscopy;  Laterality: N/A;    Social History ELFORD HISSAM  reports that he quit smoking about 25 years ago. His smoking use included cigarettes. He has a 0.50 pack-year smoking history. He has never used smokeless tobacco. He reports current alcohol use. He reports that he does not use drugs.  family history is not on file.  Allergies  Allergen Reactions   Ace Inhibitors Swelling       PHYSICAL EXAMINATION: Vital signs: BP 124/86   Pulse 73   Temp 98.6 F (37 C) (Skin)   Ht 5\' 4"  (1.626 m)   Wt 182 lb (82.6 kg)   SpO2 99%   BMI 31.24 kg/m  General: Well-developed, well-nourished, no acute distress HEENT: Sclerae are anicteric, conjunctiva pink. Oral mucosa intact Lungs: Clear Heart: Regular Abdomen: soft, nontender, nondistended, no obvious ascites, no peritoneal signs, normal bowel sounds. No organomegaly. Extremities: No edema Psychiatric: alert and oriented x3. Cooperative     ASSESSMENT:   Colon cancer screening  PLAN:  Screening colonoscopy

## 2022-09-10 ENCOUNTER — Telehealth: Payer: Self-pay | Admitting: *Deleted

## 2022-09-10 NOTE — Telephone Encounter (Signed)
  Follow up Call-     09/09/2022   10:32 AM  Call back number  Post procedure Call Back phone  # [phone]  Permission to leave phone message Yes     Patient questions:  Do you have a fever, pain , or abdominal swelling? No. Pain Score  0 *  Have you tolerated food without any problems? Yes.    Have you been able to return to your normal activities? Yes.    Do you have any questions about your discharge instructions: Diet   No. Medications  No. Follow up visit  No.  Do you have questions or concerns about your Care? No.  Actions: * If pain score is 4 or above: No action needed, pain <4.

## 2022-09-11 ENCOUNTER — Encounter: Payer: Self-pay | Admitting: Internal Medicine

## 2022-10-12 ENCOUNTER — Ambulatory Visit (INDEPENDENT_AMBULATORY_CARE_PROVIDER_SITE_OTHER): Payer: Self-pay | Admitting: Family Medicine

## 2022-10-12 ENCOUNTER — Encounter: Payer: Self-pay | Admitting: Family Medicine

## 2022-10-12 VITALS — BP 123/77 | HR 88 | Temp 98.1°F | Resp 16 | Ht 64.0 in | Wt 213.0 lb

## 2022-10-12 DIAGNOSIS — Z13220 Encounter for screening for lipoid disorders: Secondary | ICD-10-CM

## 2022-10-12 DIAGNOSIS — Z0000 Encounter for general adult medical examination without abnormal findings: Secondary | ICD-10-CM

## 2022-10-12 DIAGNOSIS — Z130 Encounter for screening for diseases of the blood and blood-forming organs and certain disorders involving the immune mechanism: Secondary | ICD-10-CM

## 2022-10-12 DIAGNOSIS — Z125 Encounter for screening for malignant neoplasm of prostate: Secondary | ICD-10-CM

## 2022-10-12 NOTE — Progress Notes (Unsigned)
New Patient Office Visit  Subjective    Patient ID: Francis Coleman, male    DOB: [birth year]  Age: 62 y.o. MRN: [id number]  CC:  Chief Complaint  Patient presents with   Annual Exam    HPI Francis Coleman presents to establish care ***  Outpatient Encounter Medications as of 10/12/2022  Medication Sig   amLODipine (NORVASC) 10 MG tablet TAKE 1 TABLET(10 MG) BY MOUTH DAILY   aspirin (EC-81 ASPIRIN) 81 MG EC tablet Take 1 tablet (81 mg total) by mouth daily. Swallow whole. (Patient not taking: Reported on 09/09/2022)   atorvastatin (LIPITOR) 40 MG tablet TAKE 1 TABLET(40 MG) BY MOUTH DAILY (Patient not taking: Reported on 09/09/2022)   hydrochlorothiazide (HYDRODIURIL) 25 MG tablet TAKE 1 TABLET(25 MG) BY MOUTH DAILY   sildenafil (REVATIO) 20 MG tablet Take 1 tablet (20 mg total) by mouth daily as needed. (Patient not taking: Reported on 08/14/2022)   tiZANidine (ZANAFLEX) 4 MG tablet Take 4 mg by mouth 2 (two) times daily. (Patient not taking: Reported on 08/14/2022)   traMADol (ULTRAM) 50 MG tablet Take 1 tablet (50 mg total) by mouth every 6 (six) hours as needed. (Patient not taking: Reported on 09/09/2022)   No facility-administered encounter medications on file as of 10/12/2022.    Past Medical History:  Diagnosis Date   GERD (gastroesophageal reflux disease)    past hx. -none recent-controls with diet.   Hypertension    S/P epidural steroid injection    due to fall and" herniated disc"   Sleep apnea    no use since 2010    Past Surgical History:  Procedure Laterality Date   CIRCUMCISION     COLON SURGERY N/A    [facility] 02/12/2020   COLONOSCOPY WITH PROPOFOL N/A 09/03/2014   Procedure: COLONOSCOPY WITH PROPOFOL;  Surgeon: Charolett Bumpers, MD;  Location: [facility];  Service: Endoscopy;  Laterality: N/A;    Family History  Problem Relation Age of Onset   Colon cancer Neg Hx    Colon polyps Neg Hx    Esophageal cancer Neg Hx    Stomach cancer Neg Hx    Rectal  cancer Neg Hx     Social History   Socioeconomic History   Marital status: Widowed    Spouse name: Not on file   Number of children: Not on file   Years of education: Not on file   Highest education level: 12th grade  Occupational History   Not on file  Tobacco Use   Smoking status: Former    Packs/day: 0.25    Years: 2.00    Additional pack years: 0.00    Total pack years: 0.50    Types: Cigarettes    Quit date: 1999    Years since quitting: 25.4   Smokeless tobacco: Never  Vaping Use   Vaping Use: Never used  Substance and Sexual Activity   Alcohol use: Yes    Comment: socially   Drug use: No   Sexual activity: Not Currently  Other Topics Concern   Not on file  Social History Narrative   Not on file   Social Determinants of Health   Financial Resource Strain: Medium Risk (08/17/2022)   Overall Financial Resource Strain (CARDIA)    Difficulty of Paying Living Expenses: Somewhat hard  Food Insecurity: Food Insecurity Present (08/17/2022)   Hunger Vital Sign    Worried About Running Out of Food in the Last Year: Sometimes true    Ran Out of  Food in the Last Year: Sometimes true  Transportation Needs: No Transportation Needs (08/17/2022)   PRAPARE - Administrator, Civil Service (Medical): No    Lack of Transportation (Non-Medical): No  Physical Activity: Insufficiently Active (08/17/2022)   Exercise Vital Sign    Days of Exercise per Week: 2 days    Minutes of Exercise per Session: 50 min  Stress: No Stress Concern Present (08/17/2022)   [employer] of Occupational Health - Occupational Stress Questionnaire    Feeling of Stress : Only a little  Social Connections: Unknown (08/17/2022)   Social Connection and Isolation Panel [NHANES]    Frequency of Communication with Friends and Family: Twice a week    Frequency of Social Gatherings with Friends and Family: Not on file    Attends Religious Services: Not on file    Active Member of Clubs or  Organizations: No    Attends Banker Meetings: Not on file    Marital Status: Widowed  Catering manager Violence: Not on file    ROS      Objective    BP 123/77   Pulse 88   Temp 98.1 F (36.7 C) (Oral)   Resp 16   Ht 5\' 4"  (1.626 m)   Wt 213 lb (96.6 kg)   BMI 36.56 kg/m   Physical Exam  {Labs (Optional):23779}    Assessment & Plan:   Problem List Items Addressed This Visit   None Visit Diagnoses     Annual physical exam    -  Primary   Relevant Orders   CMP14+EGFR   Screening for deficiency anemia       Relevant Orders   CBC with Differential   Screening for lipid disorders       Relevant Orders   Lipid Panel   Screening for prostate cancer       Relevant Orders   PSA       No follow-ups on file.   [name], MD

## 2022-10-12 NOTE — Progress Notes (Unsigned)
-  Patient is here to have annually  complete physical examination  -Care gap address -labs taken  

## 2022-10-13 ENCOUNTER — Encounter: Payer: Self-pay | Admitting: Family Medicine

## 2022-10-13 LAB — PSA: Prostate Specific Ag, Serum: 3.2 ng/mL (ref 0.0–4.0)

## 2022-10-13 LAB — CBC WITH DIFFERENTIAL/PLATELET
Basophils Absolute: 0.1 10*3/uL (ref 0.0–0.2)
Basos: 1 %
EOS (ABSOLUTE): 0.2 10*3/uL (ref 0.0–0.4)
Eos: 3 %
Hematocrit: 45.1 % (ref 37.5–51.0)
Hemoglobin: 14.6 g/dL (ref 13.0–17.7)
Immature Grans (Abs): 0.1 10*3/uL (ref 0.0–0.1)
Immature Granulocytes: 1 %
Lymphocytes Absolute: 2 10*3/uL (ref 0.7–3.1)
Lymphs: 26 %
MCH: 27.9 pg (ref 26.6–33.0)
MCHC: 32.4 g/dL (ref 31.5–35.7)
MCV: 86 fL (ref 79–97)
Monocytes Absolute: 1 10*3/uL — ABNORMAL HIGH (ref 0.1–0.9)
Monocytes: 13 %
Neutrophils Absolute: 4.3 10*3/uL (ref 1.4–7.0)
Neutrophils: 56 %
Platelets: 282 10*3/uL (ref 150–450)
RBC: 5.24 x10E6/uL (ref 4.14–5.80)
RDW: 14.7 % (ref 11.6–15.4)
WBC: 7.7 10*3/uL (ref 3.4–10.8)

## 2022-10-13 LAB — CMP14+EGFR
ALT: 25 IU/L (ref 0–44)
AST: 22 IU/L (ref 0–40)
Albumin: 4.6 g/dL (ref 3.9–4.9)
Alkaline Phosphatase: 79 IU/L (ref 44–121)
BUN/Creatinine Ratio: 14 (ref 10–24)
BUN: 18 mg/dL (ref 8–27)
Bilirubin Total: 0.4 mg/dL (ref 0.0–1.2)
CO2: 24 mmol/L (ref 20–29)
Calcium: 10.8 mg/dL — ABNORMAL HIGH (ref 8.6–10.2)
Chloride: 104 mmol/L (ref 96–106)
Creatinine, Ser: 1.33 mg/dL — ABNORMAL HIGH (ref 0.76–1.27)
Globulin, Total: 2.8 g/dL (ref 1.5–4.5)
Glucose: 75 mg/dL (ref 70–99)
Potassium: 4.6 mmol/L (ref 3.5–5.2)
Sodium: 142 mmol/L (ref 134–144)
Total Protein: 7.4 g/dL (ref 6.0–8.5)
eGFR: 60 mL/min/{1.73_m2} (ref >59)

## 2022-10-13 LAB — LIPID PANEL
Chol/HDL Ratio: 4.5 ratio (ref 0.0–5.0)
Cholesterol, Total: 204 mg/dL — ABNORMAL HIGH (ref 100–199)
HDL: 45 mg/dL (ref >39)
LDL Chol Calc (NIH): 124 mg/dL — ABNORMAL HIGH (ref 0–99)
Triglycerides: 199 mg/dL — ABNORMAL HIGH (ref 0–149)
VLDL Cholesterol Cal: 35 mg/dL (ref 5–40)

## 2022-11-03 ENCOUNTER — Telehealth: Payer: Self-pay

## 2022-11-03 ENCOUNTER — Ambulatory Visit (INDEPENDENT_AMBULATORY_CARE_PROVIDER_SITE_OTHER): Payer: No Typology Code available for payment source | Admitting: Orthopaedic Surgery

## 2022-11-03 DIAGNOSIS — Z9889 Other specified postprocedural states: Secondary | ICD-10-CM | POA: Diagnosis not present

## 2022-11-03 DIAGNOSIS — S46012S Strain of muscle(s) and tendon(s) of the rotator cuff of left shoulder, sequela: Secondary | ICD-10-CM

## 2022-11-03 NOTE — Progress Notes (Addendum)
Office Visit Note   Patient: Francis Coleman           Date of Birth: 18-Apr-1961           MRN: [id number] Visit Date: 11/03/2022              Requested by: Georganna Skeans, MD [address] [place],  [place] [address] PCP: Georganna Skeans, MD   Assessment & Plan: Visit Diagnoses:  1. 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 Hx of repair of left rotator cuff     Plan: Will proceed with the FCE.  Work slip given no work pending FCE review.  Short discussion concerning vocational rehabilitation.  His rehab nurse is not aware of any modified work activities available at his job site where he has been there daily for many years.  Follow-up after FCE for review.  Patient can continue his therapy in the meantime.  Follow-Up Instructions: No follow-ups on file.   Orders:  Orders Placed This Encounter  Procedures   Ambulatory referral to Physical Therapy   No orders of the defined types were placed in this encounter.     Procedures: No procedures performed   Clinical Data: No additional findings.   Subjective: Chief Complaint  Patient presents with   Left Shoulder - Follow-up    HPI 62 year old male returns for follow-up of left shoulder arthroscopy rotator cuff repair 06/15/2022 for an on-the-job injury.  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  Patient's continue to do therapy noted weakness with abduction.  He can get his arm over his head with flexion.  Patient worked in [employer] and had to pull down shoots push-ups shoots for [employer] and again notes that he has weakness with abduction of the shoulder.  Patient has an Pensions consultant.  Today at the end of visit we included Ms. Timberlake rehab nurse assigned to his case in the discussion her fax number is [phone].  Review of Systems updated unchanged from  09/08/2022.   Objective: Vital Signs: There were no vitals taken for this visit.  Physical Exam Constitutional:      Appearance: He is well-developed.  HENT:     Head: Normocephalic and atraumatic.     Right Ear: External ear normal.     Left Ear: External ear normal.  Eyes:     Pupils: Pupils are equal, round, and reactive to light.  Neck:     Thyroid: No thyromegaly.     Trachea: No tracheal deviation.  Cardiovascular:     Rate and Rhythm: Normal rate.  Pulmonary:     Effort: Pulmonary effort is normal.     Breath sounds: No wheezing.  Abdominal:     General: Bowel sounds are normal.     Palpations: Abdomen is soft.  Musculoskeletal:     Cervical back: Neck supple.  Skin:    General: Skin is warm and dry.     Capillary Refill: Capillary refill takes less than 2 seconds.  Neurological:     Mental Status: He is alert and oriented to person, place, and time.  Psychiatric:        Behavior: Behavior normal.        Thought Content: Thought content normal.        Judgment: Judgment normal.     Ortho Exam healed arthroscopic portals he can get his arm full  range of motion with flexion.  With abduction he abducts to about 80 degrees and is unable to continue abducting due to abduction weakness.  Healed arthroscopic portals and rotator cuff repair incision.  Sensation hand is intact.  Opposite right shoulder has good abduction strength negative impingement good flexion full range of motion.  Specialty Comments:  No specialty comments available.  Imaging: No results found.   PMFS History: Patient Active Problem List   Diagnosis Date Noted   Hx of repair of left rotator cuff 11/03/2022   Complete tear of left rotator cuff 05/13/2022   Other spondylosis with radiculopathy, cervical region 01/28/2022   Impingement syndrome of left shoulder 01/28/2022   OSA (obstructive sleep apnea) 01/25/2020   Fatigue 01/11/2020   Essential hypertension 11/09/2019   Hyperlipidemia  11/09/2019   Erectile dysfunction 09/14/2019   Past Medical History:  Diagnosis Date   GERD (gastroesophageal reflux disease)    past hx. -none recent-controls with diet.   Hypertension    S/P epidural steroid injection    due to fall and" herniated disc"   Sleep apnea    no use since 2010    Family History  Problem Relation Age of Onset   Colon cancer Neg Hx    Colon polyps Neg Hx    Esophageal cancer Neg Hx    Stomach cancer Neg Hx    Rectal cancer Neg Hx     Past Surgical History:  Procedure Laterality Date   CIRCUMCISION     COLON SURGERY N/A    [employer] 02/12/2020   COLONOSCOPY WITH PROPOFOL N/A 09/03/2014   Procedure: COLONOSCOPY WITH PROPOFOL;  Surgeon: Charolett Bumpers, MD;  Location: [facility];  Service: Endoscopy;  Laterality: N/A;   Social History   Occupational History   Not on file  Tobacco Use   Smoking status: Former    Packs/day: 0.25    Years: 2.00    Additional pack years: 0.00    Total pack years: 0.50    Types: Cigarettes    Quit date: 1999    Years since quitting: 25.5   Smokeless tobacco: Never  Vaping Use   Vaping Use: Never used  Substance and Sexual Activity   Alcohol use: Yes    Comment: socially   Drug use: No   Sexual activity: Not Currently

## 2022-11-03 NOTE — Telephone Encounter (Signed)
Auth needed for [employer]. Order placed. Also gave copy of order to [employer] field case mgr who was present with patient at appt today Ph [phone] Fax [phone] [email or web address]

## 2022-12-24 ENCOUNTER — Ambulatory Visit: Payer: Self-pay | Admitting: Podiatry

## 2022-12-29 ENCOUNTER — Ambulatory Visit: Payer: Self-pay | Admitting: Orthopaedic Surgery

## 2023-01-06 ENCOUNTER — Ambulatory Visit: Payer: Self-pay | Admitting: Orthopaedic Surgery

## 2023-01-07 ENCOUNTER — Ambulatory Visit: Payer: Self-pay | Admitting: Orthopaedic Surgery

## 2023-01-25 ENCOUNTER — Telehealth: Payer: Self-pay | Admitting: Orthopaedic Surgery

## 2023-01-25 NOTE — Telephone Encounter (Signed)
[employer] forms received. To [employer].

## 2023-01-27 ENCOUNTER — Telehealth: Payer: Self-pay | Admitting: Family Medicine

## 2023-01-27 NOTE — Telephone Encounter (Signed)
Do we have  this form for patient?

## 2023-01-27 NOTE — Telephone Encounter (Signed)
Copied from CRM [id number]. 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 Cb# [phone]

## 2023-03-10 ENCOUNTER — Telehealth: Payer: Self-pay | Admitting: Orthopaedic Surgery

## 2023-03-10 NOTE — Telephone Encounter (Signed)
Patient called. Would like some pain medication called in for him. His cb# [phone]

## 2023-03-11 NOTE — Telephone Encounter (Signed)
Please see messages below. FCE was scanned under Media tab and Tisha had scheduled return office visit for patient to review. She received email from [employer] review and stating patient had second opinion. Tisha advised patient was requesting pain medication.    [facility] sent email today to cancel the appt. to review the FCE stating had 2nd opinion. Will reply to them advising that Mr. Francis Coleman asking for pain meds and if he needs to go to physician that did the 2nd opinion ok. Thx Tisha

## 2023-03-16 ENCOUNTER — Ambulatory Visit: Payer: Self-pay | Admitting: Orthopaedic Surgery

## 2023-03-21 ENCOUNTER — Emergency Department (HOSPITAL_COMMUNITY): Payer: Self-pay

## 2023-03-21 ENCOUNTER — Other Ambulatory Visit: Payer: Self-pay

## 2023-03-21 ENCOUNTER — Encounter (HOSPITAL_COMMUNITY): Payer: Self-pay

## 2023-03-21 ENCOUNTER — Emergency Department (HOSPITAL_COMMUNITY)
Admission: EM | Admit: 2023-03-21 | Discharge: 2023-03-21 | Disposition: A | Discharge status: Home / Self Care | Payer: Self-pay | Attending: Emergency Medicine | Admitting: Emergency Medicine

## 2023-03-21 DIAGNOSIS — J181 Lobar pneumonia, unspecified organism: Secondary | ICD-10-CM | POA: Insufficient documentation

## 2023-03-21 DIAGNOSIS — R042 Hemoptysis: Secondary | ICD-10-CM | POA: Insufficient documentation

## 2023-03-21 DIAGNOSIS — Z7982 Long term (current) use of aspirin: Secondary | ICD-10-CM | POA: Diagnosis not present

## 2023-03-21 DIAGNOSIS — Z79899 Other long term (current) drug therapy: Secondary | ICD-10-CM | POA: Diagnosis not present

## 2023-03-21 DIAGNOSIS — Z1152 Encounter for screening for COVID-19: Secondary | ICD-10-CM | POA: Insufficient documentation

## 2023-03-21 DIAGNOSIS — D72829 Elevated white blood cell count, unspecified: Secondary | ICD-10-CM | POA: Insufficient documentation

## 2023-03-21 DIAGNOSIS — I10 Essential (primary) hypertension: Secondary | ICD-10-CM | POA: Diagnosis not present

## 2023-03-21 DIAGNOSIS — J029 Acute pharyngitis, unspecified: Secondary | ICD-10-CM | POA: Diagnosis present

## 2023-03-21 DIAGNOSIS — J189 Pneumonia, unspecified organism: Secondary | ICD-10-CM

## 2023-03-21 LAB — CBC
HCT: 39.4 % (ref 39.0–52.0)
Hemoglobin: 12.9 g/dL — ABNORMAL LOW (ref 13.0–17.0)
MCH: 27.8 pg (ref 26.0–34.0)
MCHC: 32.7 g/dL (ref 30.0–36.0)
MCV: 84.9 fL (ref 80.0–100.0)
Platelets: 228 10*3/uL (ref 150–400)
RBC: 4.64 MIL/uL (ref 4.22–5.81)
RDW: 16.1 % — ABNORMAL HIGH (ref 11.5–15.5)
WBC: 14.5 10*3/uL — ABNORMAL HIGH (ref 4.0–10.5)
nRBC: 0 % (ref 0.0–0.2)

## 2023-03-21 LAB — RESP PANEL BY RT-PCR (RSV, FLU A&B, COVID)  RVPGX2
Influenza A by PCR: NEGATIVE
Influenza B by PCR: NEGATIVE
Resp Syncytial Virus by PCR: NEGATIVE
SARS Coronavirus 2 by RT PCR: NEGATIVE

## 2023-03-21 LAB — BASIC METABOLIC PANEL
Anion gap: 11 (ref 5–15)
BUN: 9 mg/dL (ref 8–23)
CO2: 23 mmol/L (ref 22–32)
Calcium: 9.2 mg/dL (ref 8.9–10.3)
Chloride: 105 mmol/L (ref 98–111)
Creatinine, Ser: 1.31 mg/dL — ABNORMAL HIGH (ref 0.61–1.24)
GFR, Estimated: >60 mL/min (ref >60)
Glucose, Bld: 104 mg/dL — ABNORMAL HIGH (ref 70–99)
Potassium: 4.1 mmol/L (ref 3.5–5.1)
Sodium: 139 mmol/L (ref 135–145)

## 2023-03-21 MED ORDER — IOHEXOL 350 MG/ML SOLN
75.0000 mL | Freq: Once | INTRAVENOUS | Status: AC | PRN
  Administered 2023-03-21: 75 mL via INTRAVENOUS

## 2023-03-21 MED ORDER — AZITHROMYCIN 250 MG PO TABS: ORAL_TABLET | ORAL | 0 refills | Status: AC

## 2023-03-21 MED ORDER — AMOXICILLIN-POT CLAVULANATE 875-125 MG PO TABS: 1.0000 | ORAL_TABLET | Freq: Two times a day (BID) | ORAL | 0 refills | Status: AC

## 2023-03-21 MED ORDER — GUAIFENESIN 100 MG/5ML PO LIQD: 100.0000 mg | ORAL | 0 refills | Status: AC | PRN

## 2023-03-21 NOTE — ED Triage Notes (Signed)
Pt c.o sore throat, head pressure and cough since Thursday. States he had a few episodes of bloody mucous on Friday.

## 2023-03-21 NOTE — Discharge Instructions (Addendum)
You were seen in the ER for sore throat and cough.  Your chest CT showed some evidence of pneumonia. I am starting you on antibiotics. It is important you take the entire course!  You can take tylenol as needed for headache, and I have also prescribed a decongestant medication.  Continue to monitor how you're doing and return to the ER for new or worsening symptoms such as difficulty breathing, if you start coughing up only blood, etc.

## 2023-03-21 NOTE — ED Provider Notes (Signed)
[place] EMERGENCY DEPARTMENT AT [facility] Provider Note   CSN: [id number] Arrival date & time: 03/21/23  1115     History  Chief Complaint  Patient presents with   Hemoptysis   Sore Throat    Francis Coleman is a 62 y.o. male with history of sleep apnea, GERD, HTN, who presents to the ER complaining of sore throat, head pressure, fatigue and cough x 4 days. States he had some episodes of coughing up blood-tinged mucous 2 days ago, last episode last night. Has not taken anything for his symptoms. Also complaining of a bit of lightheadedness, no syncope.    Sore Throat Associated symptoms include headaches. Pertinent negatives include no chest pain, no abdominal pain and no shortness of breath.       Home Medications Prior to Admission medications   Medication Sig Start Date End Date Taking? Authorizing Provider  amoxicillin-clavulanate (AUGMENTIN) 875-125 MG tablet Take 1 tablet by mouth every 12 (twelve) hours. 03/21/23  Yes Davide Risdon T, PA-C  azithromycin (ZITHROMAX) 250 MG tablet Take first 2 tablets together, then 1 every day until finished. 03/21/23  Yes Valory Wetherby T, PA-C  guaiFENesin (ROBITUSSIN) 100 MG/5ML liquid Take 5-10 mLs (100-200 mg total) by mouth every 4 (four) hours as needed for cough or to loosen phlegm. 03/21/23  Yes Ameliyah Sarno T, PA-C  amLODipine (NORVASC) 10 MG tablet TAKE 1 TABLET(10 MG) BY MOUTH DAILY 04/06/22   Georganna Skeans, MD  aspirin (EC-81 ASPIRIN) 81 MG EC tablet Take 1 tablet (81 mg total) by mouth daily. Swallow whole. Patient not taking: Reported on 09/09/2022 02/16/20   Arvilla Market, MD  atorvastatin (LIPITOR) 40 MG tablet TAKE 1 TABLET(40 MG) BY MOUTH DAILY Patient not taking: Reported on 09/09/2022 04/06/22   Georganna Skeans, MD  hydrochlorothiazide (HYDRODIURIL) 25 MG tablet TAKE 1 TABLET(25 MG) BY MOUTH DAILY 04/06/22   Georganna Skeans, MD  sildenafil (REVATIO) 20 MG tablet Take 1 tablet (20 mg  total) by mouth daily as needed. Patient not taking: Reported on 08/14/2022 05/26/21   Georganna Skeans, MD  tiZANidine (ZANAFLEX) 4 MG tablet Take 4 mg by mouth 2 (two) times daily. Patient not taking: Reported on 08/14/2022 01/05/22   [provider]  traMADol (ULTRAM) 50 MG tablet Take 1 tablet (50 mg total) by mouth every 6 (six) hours as needed. Patient not taking: Reported on 09/09/2022 07/14/22   Eldred Manges, MD      Allergies    Ace inhibitors    Review of Systems   Review of Systems  Constitutional:  Negative for chills and fever.  HENT:  Positive for congestion and sore throat.   Respiratory:  Positive for cough. Negative for shortness of breath and wheezing.   Cardiovascular:  Negative for chest pain.  Gastrointestinal:  Negative for abdominal pain, nausea and vomiting.  Neurological:  Positive for light-headedness and headaches. Negative for syncope.  All other systems reviewed and are negative.   Physical Exam Updated Vital Signs BP 121/74   Pulse 100   Temp 97.7 F (36.5 C) (Oral)   Resp 18   Ht 5\' 4"  (1.626 m)   Wt 83 kg   SpO2 97%   BMI 31.41 kg/m  Physical Exam Vitals and nursing note reviewed.  Constitutional:      Appearance: Normal appearance.  HENT:     Head: Normocephalic and atraumatic.  Eyes:     Conjunctiva/sclera: Conjunctivae normal.  Cardiovascular:     Rate and Rhythm:  Normal rate and regular rhythm.  Pulmonary:     Effort: Pulmonary effort is normal. No respiratory distress.     Breath sounds: Rhonchi present.     Comments: Mild rhonchi in the lower lung fields, clears with coughing Abdominal:     General: There is no distension.     Palpations: Abdomen is soft.     Tenderness: There is no abdominal tenderness.  Skin:    General: Skin is warm and dry.  Neurological:     General: No focal deficit present.     Mental Status: He is alert.     ED Results / Procedures / Treatments   Labs (all labs ordered are listed, but only  abnormal results are displayed) Labs Reviewed  CBC - Abnormal; Notable for the following components:      Result Value   WBC 14.5 (*)    Hemoglobin 12.9 (*)    RDW 16.1 (*)    All other components within normal limits  BASIC METABOLIC PANEL - Abnormal; Notable for the following components:   Glucose, Bld 104 (*)    Creatinine, Ser 1.31 (*)    All other components within normal limits  RESP PANEL BY RT-PCR (RSV, FLU A&B, COVID)  RVPGX2    EKG None  Radiology CT Chest W Contrast  Result Date: 03/21/2023 CLINICAL DATA:  Respiratory illness, cough. EXAM: CT CHEST WITH CONTRAST TECHNIQUE: Multidetector CT imaging of the chest was performed during intravenous contrast administration. RADIATION DOSE REDUCTION: This exam was performed according to the departmental dose-optimization program which includes automated exposure control, adjustment of the mA and/or kV according to patient size and/or use of iterative reconstruction technique. CONTRAST:  75mL OMNIPAQUE IOHEXOL 350 MG/ML SOLN COMPARISON:  Same day chest radiograph. FINDINGS: Cardiovascular: No significant vascular findings. Normal heart size. No pericardial effusion. Mediastinum/Nodes: No enlarged mediastinal, hilar, or axillary lymph nodes. Thyroid gland, trachea, and esophagus demonstrate no significant findings. Lungs/Pleura: There is mild scattered tree-in-bud opacities and nodular consolidation in the left lower lobe. The more peripheral component corresponds to the finding seen on same day chest radiograph. There is mild right lower lobe atelectasis. No pleural effusion or pneumothorax. Upper Abdomen: A 2.0 cm hypoattenuating lesion is seen in the caudate lobe. This was likely present on prior exam dated 03/07/2009. Musculoskeletal: No chest wall abnormality. No acute or significant osseous findings. IMPRESSION: 1. Mild scattered tree-in-bud opacities and nodular consolidation in the left lower lobe most likely reflects pneumonia. The  more peripheral component corresponds to the finding seen on same day chest radiograph. Recommend imaging follow-up to document resolution. 2. A 2.0 cm hypoattenuating lesion in the caudate lobe was likely present on prior exam dated 03/07/2009. This is incompletely characterized on today's exam, but favored to represent a benign lesion such as a hemangioma. Electronically Signed   By: Romona Curls M.D.   On: 03/21/2023 14:51   DG Chest 2 View  Result Date: 03/21/2023 CLINICAL DATA:  Cough.  Hemoptysis. EXAM: CHEST - 2 VIEW COMPARISON:  02/15/2020. FINDINGS: There is an approximately 3.7 x 3.8 cm rounded faint heterogeneous opacity overlying the left lower lung zone. The opacities not distinctly seen on the lateral film. It is unclear whether this is true intraparenchymal opacity versus summation artifact. This is incompletely evaluated on the current exam. Further evaluation with chest CT scan is recommended. Bilateral lung fields are otherwise clear. No acute consolidation or lung collapse. Bilateral costophrenic angles are clear. Normal cardio-mediastinal silhouette. No acute osseous abnormalities. The soft  tissues are within normal limits. IMPRESSION: *Heterogeneous faint opacity overlying the left lower lung zone. Further evaluation with chest CT scan is recommended. Electronically Signed   By: Jules Schick M.D.   On: 03/21/2023 13:25    Procedures Procedures    Medications Ordered in ED Medications  iohexol (OMNIPAQUE) 350 MG/ML injection 75 mL (75 mLs Intravenous Contrast Given 03/21/23 1354)    ED Course/ Medical Decision Making/ A&P Clinical Course as of 03/21/23 1512  Sun Mar 21, 2023  1330 XR reviewed with inconclusive findings, radiologist recommended follow up with CT chest which has been ordered [LR]    Clinical Course User Index [LR] Shae Hinnenkamp, Lora Paula, PA-C                                 Medical Decision Making Amount and/or Complexity of Data Reviewed Labs:  ordered. Radiology: ordered.   This patient is a 62 y.o. male  who presents to the ED for concern of cough, sore throat, headache x 4 days.   Differential diagnoses prior to evaluation: The emergent differential diagnosis includes, but is not limited to,  Upper respiratory infection, lower respiratory infection, allergies, asthma, irritants, foreign body, medications (ACE inhibitors), reflux, CHF, lung cancer, interstitial lung disease, psychiatric causes, postnasal drip. This is not an exhaustive differential.   Past Medical History / Co-morbidities / Social History: sleep apnea, GERD, HTN  Physical Exam: Physical exam performed. The pertinent findings include: Clinically well appearing. Normal vital signs, no acute distress. Mild rhonchi in the lung bases, clears with coughing. No peripheral edema.   Well's criteria low risk for PE  Lab Tests/Imaging studies: I personally interpreted labs/imaging and the pertinent results include:  leukocytosis of 14.5, hemoglobin stable at 12.9. BMP with stable kidney function compared to prior, mildly elevated glucose, otherwise normal. Respiratory panel negative.  CXR with heterogenous faint opacity overlying left lower lung zone, recommend CT. Chest CT with mild scattered opacities and nodular consolidation in left lower lobe, likely consistent with pneumonia.  I agree with the radiologist interpretation.  Disposition: After consideration of the diagnostic results and the patients response to treatment, I feel that emergency department workup does not suggest an emergent condition requiring admission or immediate intervention beyond what has been performed at this time. The plan is: Discharged home with treatment of community-acquired pneumonia of the left lower lobe.  This does appear clinically consistent with patient's report, and suggested based on imaging.  Will give antibiotics and decongestant (safe with hypertension), and have patient follow-up  with his PCP. Well's criteria low risk for PE, and no other emergent etiologies suspected for symptoms at this time. The patient is safe for discharge and has been instructed to return immediately for worsening symptoms, change in symptoms or any other concerns.  Final Clinical Impression(s) / ED Diagnoses Final diagnoses:  Community acquired pneumonia of left lower lobe of lung    Rx / DC Orders ED Discharge Orders          Ordered    amoxicillin-clavulanate (AUGMENTIN) 875-125 MG tablet  Every 12 hours        03/21/23 1510    azithromycin (ZITHROMAX) 250 MG tablet        03/21/23 1510    guaiFENesin (ROBITUSSIN) 100 MG/5ML liquid  Every 4 hours PRN        03/21/23 1510           Portions of this  report may have been transcribed using voice recognition software. Every effort was made to ensure accuracy; however, inadvertent computerized transcription errors may be present.    Jeanella Flattery 03/21/23 1512    Derwood Kaplan, MD 03/21/23 1614

## 2023-03-21 NOTE — ED Notes (Signed)
Patient transported to X-ray 

## 2023-04-03 ENCOUNTER — Emergency Department (HOSPITAL_COMMUNITY): Payer: Medicaid Other

## 2023-04-03 ENCOUNTER — Other Ambulatory Visit: Payer: Self-pay

## 2023-04-03 ENCOUNTER — Emergency Department (HOSPITAL_COMMUNITY)
Admission: EM | Admit: 2023-04-03 | Discharge: 2023-04-03 | Disposition: A | Discharge status: Home / Self Care | Payer: Medicaid Other | Attending: Emergency Medicine | Admitting: Emergency Medicine

## 2023-04-03 ENCOUNTER — Encounter (HOSPITAL_COMMUNITY): Payer: Self-pay | Admitting: Emergency Medicine

## 2023-04-03 DIAGNOSIS — Z79899 Other long term (current) drug therapy: Secondary | ICD-10-CM | POA: Insufficient documentation

## 2023-04-03 DIAGNOSIS — R1032 Left lower quadrant pain: Secondary | ICD-10-CM | POA: Diagnosis present

## 2023-04-03 DIAGNOSIS — R531 Weakness: Secondary | ICD-10-CM | POA: Insufficient documentation

## 2023-04-03 DIAGNOSIS — I10 Essential (primary) hypertension: Secondary | ICD-10-CM | POA: Insufficient documentation

## 2023-04-03 DIAGNOSIS — M79605 Pain in left leg: Secondary | ICD-10-CM | POA: Insufficient documentation

## 2023-04-03 MED ORDER — NAPROXEN 500 MG PO TABS: 500.0000 mg | ORAL_TABLET | Freq: Two times a day (BID) | ORAL | 0 refills | Status: AC

## 2023-04-03 MED ORDER — PREDNISONE 20 MG PO TABS: 40.0000 mg | ORAL_TABLET | Freq: Every day | ORAL | 0 refills | Status: AC

## 2023-04-03 NOTE — ED Triage Notes (Signed)
Pt reports three days of left groin pain. Pt is ambulatory, states pain radiates down anterior of left leg. Pt denies recent injury.

## 2023-04-03 NOTE — ED Notes (Addendum)
Patient standing and walking around his hall bed independently. Slight dragging of left foot noted, due to pain.

## 2023-04-03 NOTE — ED Provider Notes (Signed)
[place] EMERGENCY DEPARTMENT AT [facility] Provider Note   CSN: [id number] Arrival date & time: 04/03/23  0831     History  Chief Complaint  Patient presents with   Groin Pain    Francis Coleman is a 62 y.o. male with a past medical history significant for hypertension, hyperlipidemia, OSA who presents to the ED due to L sided groin pain. His symptoms have been going on for the past 3 days, with a more severe episode last night that caused difficulty sleeping. He describes the pain as sharp, stabbing sensation at the L groin crease and radiating down the L leg on the medial side down to the ankle. He also reports LLE weakness due to pain. He took one 800mg  Motrin at home at 8:30pm last night with little relief. His symptoms are aggravated by turning in the bed or trying to lay on his stomach. He is not sexually active. Denies any change in activity or heaving lifting. Denies trauma, numbness/tingling, dysuria, penile discharge, N/V, fever, saddle anesthesia, bowel/bladder incontinence, fever/chills, IV drug use, history of cancer.  History obtained from patient and past medical records. No interpreter used during encounter.       Home Medications Prior to Admission medications   Medication Sig Start Date End Date Taking? Authorizing Provider  naproxen (NAPROSYN) 500 MG tablet Take 1 tablet (500 mg total) by mouth 2 (two) times daily. 04/03/23  Yes Mayerly Kaman, Merla Riches, PA-C  predniSONE (DELTASONE) 20 MG tablet Take 2 tablets (40 mg total) by mouth daily for 5 days. 04/03/23 04/08/23 Yes Daisey Caloca C, PA-C  amLODipine (NORVASC) 10 MG tablet TAKE 1 TABLET(10 MG) BY MOUTH DAILY 04/06/22   Georganna Skeans, MD  amoxicillin-clavulanate (AUGMENTIN) 875-125 MG tablet Take 1 tablet by mouth every 12 (twelve) hours. 03/21/23   Roemhildt, Lorin T, PA-C  aspirin (EC-81 ASPIRIN) 81 MG EC tablet Take 1 tablet (81 mg total) by mouth daily. Swallow whole. Patient not taking: Reported  on 09/09/2022 02/16/20   Arvilla Market, MD  atorvastatin (LIPITOR) 40 MG tablet TAKE 1 TABLET(40 MG) BY MOUTH DAILY Patient not taking: Reported on 09/09/2022 04/06/22   Georganna Skeans, MD  azithromycin (ZITHROMAX) 250 MG tablet Take first 2 tablets together, then 1 every day until finished. 03/21/23   Roemhildt, Lorin T, PA-C  guaiFENesin (ROBITUSSIN) 100 MG/5ML liquid Take 5-10 mLs (100-200 mg total) by mouth every 4 (four) hours as needed for cough or to loosen phlegm. 03/21/23   Roemhildt, Lorin T, PA-C  hydrochlorothiazide (HYDRODIURIL) 25 MG tablet TAKE 1 TABLET(25 MG) BY MOUTH DAILY 04/06/22   Georganna Skeans, MD  sildenafil (REVATIO) 20 MG tablet Take 1 tablet (20 mg total) by mouth daily as needed. Patient not taking: Reported on 08/14/2022 05/26/21   Georganna Skeans, MD  tiZANidine (ZANAFLEX) 4 MG tablet Take 4 mg by mouth 2 (two) times daily. Patient not taking: Reported on 08/14/2022 01/05/22   [provider]  traMADol (ULTRAM) 50 MG tablet Take 1 tablet (50 mg total) by mouth every 6 (six) hours as needed. Patient not taking: Reported on 09/09/2022 07/14/22   Eldred Manges, MD      Allergies    Ace inhibitors    Review of Systems   Review of Systems  Constitutional:  Negative for fever.  Gastrointestinal:  Negative for abdominal pain.  Musculoskeletal:  Positive for arthralgias and gait problem.    Physical Exam Updated Vital Signs BP 128/85 (BP Location: Right Arm)   Pulse  91   Temp 98.5 F (36.9 C) (Oral)   Resp 18   Ht 5\' 4"  (1.626 m)   Wt 81.6 kg   SpO2 98%   BMI 30.90 kg/m  Physical Exam Vitals and nursing note reviewed.  Constitutional:      General: He is not in acute distress.    Appearance: He is not ill-appearing.  HENT:     Head: Normocephalic.  Eyes:     Pupils: Pupils are equal, round, and reactive to light.  Cardiovascular:     Rate and Rhythm: Normal rate and regular rhythm.     Pulses: Normal pulses.     Heart sounds: Normal  heart sounds. No murmur heard.    No friction rub. No gallop.  Pulmonary:     Effort: Pulmonary effort is normal.     Breath sounds: Normal breath sounds.  Abdominal:     General: Abdomen is flat. There is no distension.     Palpations: Abdomen is soft.     Tenderness: There is no abdominal tenderness. There is no guarding or rebound.     Comments: Abdomen soft, nondistended, nontender to palpation in all quadrants without guarding or peritoneal signs. No rebound.   Musculoskeletal:        General: Normal range of motion.     Cervical back: Neck supple.     Comments: Slight decreased in left hip flexion. LLE pedal pulses palpable.  No thoracic or lumbar midline tenderness.  Skin:    General: Skin is warm and dry.  Neurological:     General: No focal deficit present.     Mental Status: He is alert.  Psychiatric:        Mood and Affect: Mood normal.        Behavior: Behavior normal.     ED Results / Procedures / Treatments   Labs (all labs ordered are listed, but only abnormal results are displayed) Labs Reviewed - No data to display  EKG None  Radiology DG Lumbar Spine Complete  Result Date: 04/03/2023 CLINICAL DATA:  Left groin pain radiates down the leg EXAM: LUMBAR SPINE - COMPLETE 4+ VIEW COMPARISON:  None Available. FINDINGS: There is no evidence of lumbar spine fracture. Alignment is normal. Mild loss of disc height noted T11-12 and L4-5. SI joints unremarkable. IMPRESSION: Mild loss of disc height at T11-12 and L4-5. Electronically Signed   By: Kennith Center M.D.   On: 04/03/2023 11:19   DG Hip Unilat W or Wo Pelvis 2-3 Views Left  Result Date: 04/03/2023 CLINICAL DATA:  Left groin pain EXAM: DG HIP (WITH OR WITHOUT PELVIS) 2-3V LEFT COMPARISON:  None Available. FINDINGS: There is no evidence of hip fracture or dislocation. There is no evidence of arthropathy or other focal bone abnormality. IMPRESSION: Negative. Electronically Signed   By: Kennith Center M.D.   On:  04/03/2023 11:18    Procedures Procedures    Medications Ordered in ED Medications - No data to display  ED Course/ Medical Decision Making/ A&P                                 Medical Decision Making Amount and/or Complexity of Data Reviewed Radiology: ordered and independent interpretation performed. Decision-making details documented in ED Course.  Risk Prescription drug management.   62 year old male presents to the ED due to left-sided groin pain x 3 days that radiates down left lower extremity.  Denies low back pain.  Denies saddle anesthesia, bowel/bladder incontinence, lower extremity numbness/tingling.  Does admit to some left lower extremity weakness secondary to pain.  Denies any testicular pain or penile discharge.  No injury.  Denies fever/chills.  No history of IV drug use.  Upon arrival, vitals all within normal limits.  Patient in no acute distress.  Reassuring physical exam.  Left lower extremity neurovascularly intact with soft compartments.  Low suspicion for compartment syndrome.  No thoracic or lumbar midline tenderness.  No tenderness to left side of hip.  No evidence of septic joint.  Abdomen soft, nondistended, nontender.  No evidence of hernias on exam.  Possible MSK etiology.  Low suspicion for testicular torsion given no testicular pain.  X-rays ordered.  Patient declined any pain medication.  X-rays personally reviewed and interpreted which demonstrates mild loss of disc height at T11-12 and L4-5.  Hip x-ray negative for any acute abnormalities.  Agree with radiology interpretation.  Patient able to ambulate in the ED without difficulty.  Low suspicion for cauda equina or central cord compression. No Infectious symptoms to suggest infectious etiology.  Possible MSK etiology.  Given possible radicular symptoms will discharge patient with prednisone. No history of DM.  Naproxen for pain.  Advised patient to follow-up with PCP if symptoms do not improve over the next  few days.  No hernia evident on exam.  No urinary symptoms to suggest UTI/pyelonephritis. Patient stable for discharge. Strict ED precautions discussed with patient. Patient states understanding and agrees to plan. Patient discharged home in no acute distress and stable vitals  Has PCP  Lives at home Hx HTN, HLD       Final Clinical Impression(s) / ED Diagnoses Final diagnoses:  Left leg pain    Rx / DC Orders ED Discharge Orders          Ordered    naproxen (NAPROSYN) 500 MG tablet  2 times daily        04/03/23 1126    predniSONE (DELTASONE) 20 MG tablet  Daily        04/03/23 1126              Mannie Stabile, [place] 04/03/23 1135    Maia Plan, MD 04/06/23 1245

## 2023-04-03 NOTE — Discharge Instructions (Addendum)
It was a pleasure taking care of you today.  As discussed, he did have some arthritis in your low back.  I am sending home with pain medication and steroids.  Take steroids for the next 5 days.  Please follow-up with PCP in 2 to 3 days for recheck.  Return to the ER for new or worsening symptoms.  You may also purchase over the counter lidoderm patches and voltaren gel for added pain relief.

## 2023-04-29 ENCOUNTER — Ambulatory Visit: Payer: Self-pay | Admitting: *Deleted

## 2023-04-29 ENCOUNTER — Ambulatory Visit
Admission: EM | Admit: 2023-04-29 | Discharge: 2023-04-29 | Disposition: A | Discharge status: Home / Self Care | Payer: Medicaid Other | Attending: Physician Assistant | Admitting: Physician Assistant

## 2023-04-29 ENCOUNTER — Ambulatory Visit (INDEPENDENT_AMBULATORY_CARE_PROVIDER_SITE_OTHER): Payer: Medicaid Other

## 2023-04-29 ENCOUNTER — Other Ambulatory Visit: Payer: Self-pay

## 2023-04-29 ENCOUNTER — Encounter: Payer: Self-pay | Admitting: *Deleted

## 2023-04-29 DIAGNOSIS — R051 Acute cough: Secondary | ICD-10-CM

## 2023-04-29 DIAGNOSIS — R059 Cough, unspecified: Secondary | ICD-10-CM | POA: Insufficient documentation

## 2023-04-29 DIAGNOSIS — J209 Acute bronchitis, unspecified: Secondary | ICD-10-CM

## 2023-04-29 MED ORDER — DOXYCYCLINE HYCLATE 100 MG PO CAPS: 100.0000 mg | ORAL_CAPSULE | Freq: Two times a day (BID) | ORAL | 0 refills | Status: AC

## 2023-04-29 NOTE — Telephone Encounter (Signed)
  Chief Complaint: chest pain with coughing, s/p pneumonia  Symptoms: 1124/24/ dx pnemonia. Sx returning cough , with chest pain, chest sounds rattling. Denies SOB, runny nose with green mucus. Head stopped up'. Left ear pain Frequency: sx never got better since 03/21/23  Pertinent Negatives: Patient denies chest pain with difficulty breathing. No fever, no SOB.  Disposition: [] ED /[x] Urgent Care (no appt availability in office) / [] Appointment(In office/virtual)/ []  [facility]/ [] Home Care/ [] Refused Recommended Disposition /[] [facility]/ []  Follow-up with PCP Additional Notes:   No available appt with PCP until Jan 17. Recommended UC      Reason for Disposition  Earache  Answer Assessment - Initial Assessment Questions 1. ONSET: When did the cough begin?      03/21/23 2. SEVERITY: How bad is the cough today?      On going since 03/21/23 3. SPUTUM: Describe the color of your sputum (none, dry cough; clear, white, yellow, green)     Green  4. HEMOPTYSIS: Are you coughing up any blood? If so ask: How much? (flecks, streaks, tablespoons, etc.)     na 5. DIFFICULTY BREATHING: Are you having difficulty breathing? If Yes, ask: How bad is it? (e.g., mild, moderate, severe)    - MILD: No SOB at rest, mild SOB with walking, speaks normally in sentences, can lie down, no retractions, pulse < 100.    - MODERATE: SOB at rest, SOB with minimal exertion and prefers to sit, cannot lie down flat, speaks in phrases, mild retractions, audible wheezing, pulse 100-120.    - SEVERE: Very SOB at rest, speaks in single words, struggling to breathe, sitting hunched forward, retractions, pulse > 120      na 6. FEVER: Do you have a fever? If Yes, ask: What is your temperature, how was it measured, and when did it start?     na 7. CARDIAC HISTORY: Do you have any history of heart disease? (e.g., heart attack, congestive heart failure)      Na  8. LUNG HISTORY:  Do you have any history of lung disease?  (e.g., pulmonary embolus, asthma, emphysema)     Hx pneumonia  9. PE RISK FACTORS: Do you have a history of blood clots? (or: recent major surgery, recent prolonged travel, bedridden)     na 10. OTHER SYMPTOMS: Do you have any other symptoms? (e.g., runny nose, wheezing, chest pain)       Chest pain with coughing chest sound rattling left ear pain runny nose green mucus 11. PREGNANCY: Is there any chance you are pregnant? When was your last menstrual period?       na 12. TRAVEL: Have you traveled out of the country in the last month? (e.g., travel history, exposures)       na  Protocols used: Cough - Acute Productive-A-AH

## 2023-04-29 NOTE — Discharge Instructions (Signed)
 Return if any problems.

## 2023-04-29 NOTE — ED Triage Notes (Signed)
 Pt reports a rattling in his chest ,SHOB and LT ear Pain . Pt had PNA at Thanksgiving and thinks he has PNA again.

## 2023-05-01 NOTE — ED Provider Notes (Signed)
 [facility]    CSN: [id number] Arrival date & time: 04/29/23  1100      History   Chief Complaint Chief Complaint  Patient presents with   Shortness of Breath   Cough   Otalgia    HPI Francis Coleman is a 63 y.o. male.   Patient complains of a cough and congestion.  Patient reports that he was treated for pneumonia over Thanksgiving.  He is concerned that he may have pneumonia again.  Patient denies any current fever or chills.  Patient is followed by Dr. Tanda.  He has a past medical history of hypertension and GERD  The history is provided by the patient.  Shortness of Breath Associated symptoms: cough and ear pain   Cough Associated symptoms: ear pain and shortness of breath   Otalgia Associated symptoms: cough     Past Medical History:  Diagnosis Date   GERD (gastroesophageal reflux disease)    past hx. -none recent-controls with diet.   Hypertension    S/P epidural steroid injection    due to fall and herniated disc   Sleep apnea    no use since 2010    Patient Active Problem List   Diagnosis Date Noted   Cough 04/29/2023   Hx of repair of left rotator cuff 11/03/2022   Complete tear of left rotator cuff 05/13/2022   Other spondylosis with radiculopathy, cervical region 01/28/2022   Impingement syndrome of left shoulder 01/28/2022   OSA (obstructive sleep apnea) 01/25/2020   Fatigue 01/11/2020   Essential hypertension 11/09/2019   Hyperlipidemia 11/09/2019   Erectile dysfunction 09/14/2019    Past Surgical History:  Procedure Laterality Date   CIRCUMCISION     COLON SURGERY N/A    [employer] 02/12/2020   COLONOSCOPY WITH PROPOFOL  N/A 09/03/2014   Procedure: COLONOSCOPY WITH PROPOFOL ;  Surgeon: Gladis MARLA Louder, MD;  Location: [facility];  Service: Endoscopy;  Laterality: N/A;       Home Medications    Prior to Admission medications   Medication Sig Start Date End Date Taking? Authorizing Provider  amLODipine  (NORVASC ) 10 MG  tablet TAKE 1 TABLET(10 MG) BY MOUTH DAILY 04/06/22  Yes Tanda Bleacher, MD  doxycycline  (VIBRAMYCIN ) 100 MG capsule Take 1 capsule (100 mg total) by mouth 2 (two) times daily. 04/29/23  Yes Flint Raring K, PA-C  hydrochlorothiazide  (HYDRODIURIL ) 25 MG tablet TAKE 1 TABLET(25 MG) BY MOUTH DAILY 04/06/22  Yes Tanda Bleacher, MD  naproxen  (NAPROSYN ) 500 MG tablet Take 1 tablet (500 mg total) by mouth 2 (two) times daily. 04/03/23  Yes Lorelle Aleck BROCKS, PA-C  amoxicillin -clavulanate (AUGMENTIN ) 875-125 MG tablet Take 1 tablet by mouth every 12 (twelve) hours. 03/21/23   Roemhildt, Lorin T, PA-C  aspirin  (EC-81 ASPIRIN ) 81 MG EC tablet Take 1 tablet (81 mg total) by mouth daily. Swallow whole. Patient not taking: Reported on 09/09/2022 02/16/20   Prentiss Dorothyann Maxwell, MD  atorvastatin  (LIPITOR) 40 MG tablet TAKE 1 TABLET(40 MG) BY MOUTH DAILY Patient not taking: Reported on 09/09/2022 04/06/22   Tanda Bleacher, MD  azithromycin  (ZITHROMAX ) 250 MG tablet Take first 2 tablets together, then 1 every day until finished. 03/21/23   Roemhildt, Lorin T, PA-C  guaiFENesin  (ROBITUSSIN) 100 MG/5ML liquid Take 5-10 mLs (100-200 mg total) by mouth every 4 (four) hours as needed for cough or to loosen phlegm. 03/21/23   Roemhildt, Lorin T, PA-C  sildenafil  (REVATIO ) 20 MG tablet Take 1 tablet (20 mg total) by mouth daily as needed. Patient  not taking: Reported on 08/14/2022 05/26/21   Tanda Bleacher, MD  tiZANidine (ZANAFLEX) 4 MG tablet Take 4 mg by mouth 2 (two) times daily. Patient not taking: Reported on 08/14/2022 01/05/22   [provider]  traMADol  (ULTRAM ) 50 MG tablet Take 1 tablet (50 mg total) by mouth every 6 (six) hours as needed. Patient not taking: Reported on 09/09/2022 07/14/22   Barbarann Oneil BROCKS, MD    Family History Family History  Problem Relation Age of Onset   Colon cancer Neg Hx    Colon polyps Neg Hx    Esophageal cancer Neg Hx    Stomach cancer Neg Hx    Rectal cancer Neg Hx      Social History Social History   Tobacco Use   Smoking status: Former    Current packs/day: 0.00    Average packs/day: 0.3 packs/day for 2.0 years (0.5 ttl pk-yrs)    Types: Cigarettes    Start date: 16    Quit date: 1999    Years since quitting: 26.0   Smokeless tobacco: Never  Vaping Use   Vaping status: Never Used  Substance Use Topics   Alcohol use: Yes    Comment: socially   Drug use: No     Allergies   Ace inhibitors   Review of Systems Review of Systems  HENT:  Positive for ear pain.   Respiratory:  Positive for cough and shortness of breath.   All other systems reviewed and are negative.    Physical Exam Triage Vital Signs ED Triage Vitals  Encounter Vitals Group     BP 04/29/23 1237 118/81     Systolic BP Percentile --      Diastolic BP Percentile --      Pulse Rate 04/29/23 1237 73     Resp 04/29/23 1237 20     Temp 04/29/23 1237 97.9 F (36.6 C)     Temp src --      SpO2 04/29/23 1237 96 %     Weight --      Height --      Head Circumference --      Peak Flow --      Pain Score 04/29/23 1234 5     Pain Loc --      Pain Education --      Exclude from Growth Chart --    No data found.  Updated Vital Signs BP 118/81   Pulse 73   Temp 97.9 F (36.6 C)   Resp 20   SpO2 96%   Visual Acuity Right Eye Distance:   Left Eye Distance:   Bilateral Distance:    Right Eye Near:   Left Eye Near:    Bilateral Near:     Physical Exam Vitals and nursing note reviewed.  Constitutional:      Appearance: He is well-developed.  HENT:     Head: Normocephalic.  Cardiovascular:     Rate and Rhythm: Normal rate and regular rhythm.  Pulmonary:     Effort: Pulmonary effort is normal.  Abdominal:     General: There is no distension.  Musculoskeletal:        General: Normal range of motion.     Cervical back: Normal range of motion.  Skin:    General: Skin is warm.  Neurological:     Mental Status: He is alert and oriented to person,  place, and time.      UC Treatments / Results  Labs (all labs  ordered are listed, but only abnormal results are displayed) Labs Reviewed - No data to display  EKG   Radiology DG Chest 2 View Result Date: 04/29/2023 CLINICAL DATA:  Cough, shortness of breath. EXAM: CHEST - 2 VIEW COMPARISON:  March 21, 2023. FINDINGS: The heart size and mediastinal contours are within normal limits. Right lung is clear. Small nodular densities remain in left lung base which may represent atypical inflammation as noted on prior CT scan. The visualized skeletal structures are unremarkable. IMPRESSION: Small nodular densities remain in left lung base which may represent atypical inflammation as noted on prior CT scan, but continued radiographic or CT follow-up is recommended to ensure resolution and rule out neoplasm. Electronically Signed   By: Lynwood Landy Raddle M.D.   On: 04/29/2023 16:05    Procedures Procedures (including critical care time)  Medications Ordered in UC Medications - No data to display  Initial Impression / Assessment and Plan / UC Course  I have reviewed the triage vital signs and the nursing notes.  Pertinent labs & imaging results that were available during my care of the patient were reviewed by me and considered in my medical decision making (see chart for details).     Chest x-ray reviewed I do not see any signs of pneumonia.  Patient is placed on a prescription for doxycycline  he is advised to follow-up with his primary care physician for recheck next week.  Advised for need of follow-up imaging. Final Clinical Impressions(s) / UC Diagnoses   Final diagnoses:  Acute cough  Acute bronchitis, unspecified organism     Discharge Instructions      Return if any problems.     ED Prescriptions     Medication Sig Dispense Auth. Provider   doxycycline  (VIBRAMYCIN ) 100 MG capsule Take 1 capsule (100 mg total) by mouth 2 (two) times daily. 20 capsule Julyanna Scholle K, PA-C       PDMP not reviewed this encounter. An After Visit Summary was printed and given to the patient.    Flint Sonny POUR, PA-C 05/01/23 1240

## 2023-05-03 ENCOUNTER — Other Ambulatory Visit: Payer: Self-pay | Admitting: Family Medicine

## 2023-05-03 DIAGNOSIS — I10 Essential (primary) hypertension: Secondary | ICD-10-CM

## 2023-05-03 NOTE — Telephone Encounter (Signed)
 Medication Refill -  Most Recent Primary Care Visit:  Provider: TANDA BLEACHER  Department: [facility]  Visit Type: PHYSICAL  Date: 10/12/2022    last appt June 2024   Medication: amLODipine  (NORVASC ) 10 MG tablet   Has the patient contacted their pharmacy? Yes (Agent: If no, request that the patient contact the pharmacy for the refill. If patient does not wish to contact the pharmacy document the reason why and proceed with request.) (Agent: If yes, when and what did the pharmacy advise?)  Is this the correct pharmacy for this prescription? Yes If no, delete pharmacy and type the correct one.  This is the patient's preferred pharmacy:  [id number] [place], [place] - [address] AT [facility] & [address] [place] [address] Phone: [phone] Fax: [phone]   Has the prescription been filled recently? No  Is the patient out of the medication? Yes  Yes  Can we respond through MyChart? Yes  Agent: Please be advised that Rx refills may take up to 3 business days. We ask that you follow-up with your pharmacy.

## 2023-05-04 NOTE — Telephone Encounter (Signed)
 Copied from CRM [id number]. Topic: General - Other >> May 04, 2023  3:44 PM Everette C wrote: Reason for CRM: The patient has called to follow up on the status of their previous refill request of amLODipine  (NORVASC ) 10 MG tablet [[id number]]  Please contact the patient further when possible

## 2023-05-05 ENCOUNTER — Other Ambulatory Visit: Payer: Self-pay | Admitting: Family Medicine

## 2023-05-05 ENCOUNTER — Telehealth: Payer: Self-pay | Admitting: Family Medicine

## 2023-05-05 DIAGNOSIS — I10 Essential (primary) hypertension: Secondary | ICD-10-CM

## 2023-05-05 MED ORDER — AMLODIPINE BESYLATE 10 MG PO TABS: ORAL_TABLET | ORAL | 0 refills | Status: DC

## 2023-05-05 NOTE — Telephone Encounter (Signed)
 Courtesy refill given 05/05/23, appointment needed.

## 2023-05-05 NOTE — Telephone Encounter (Signed)
 Medication Refill -  Most Recent Primary Care Visit:  Provider: TANDA BLEACHER  Department: [facility]  Visit Type: PHYSICAL  Date: 10/12/2022  Medication: amLODipine  (NORVASC ) 10 MG tablet   Has the patient contacted their pharmacy? Yes (Agent: If no, request that the patient contact the pharmacy for the refill. If patient does not wish to contact the pharmacy document the reason why and proceed with request.) (Agent: If yes, when and what did the pharmacy advise?)  Is this the correct pharmacy for this prescription? Yes  This is the patient's preferred pharmacy:  [facility] #[id number] [place], [place] - [address] AT [facility] OF [facility] & [address] [place] [address] Phone: [phone] Fax: [phone]   Has the prescription been filled recently? No  Is the patient out of the medication? Yes  Has the patient been seen for an appointment in the last year OR does the patient have an upcoming appointment? No  apt scheduled for 1/15 at 10:20. Can he get enough to last until his appt.   Can we respond through MyChart? No  Agent: Please be advised that Rx refills may take up to 3 business days. We ask that you follow-up with your pharmacy.

## 2023-05-05 NOTE — Telephone Encounter (Signed)
 Pt called checking the status of their refill request 05/05/2023 8:57 am   Francis Coleman    05/03/23  9:55 AM Note Medication Refill -  Most Recent Primary Care Visit:  Provider: TANDA BLEACHER  Department: [facility]  Visit Type: PHYSICAL  Date: 10/12/2022    last appt June 2024   Medication: amLODipine  (NORVASC ) 10 MG tablet    Has the patient contacted their pharmacy? Yes (Agent: If no, request that the patient contact the pharmacy for the refill. If patient does not wish to contact the pharmacy document the reason why and proceed with request.) (Agent: If yes, when and what did the pharmacy advise?)   Is this the correct pharmacy for this prescription? Yes If no, delete pharmacy and type the correct one.  This is the patient's preferred pharmacy:  [id number] [place], [place] - [address] AT [facility] OF [facility] & [address] [place] [address] Phone: [phone] Fax: [phone]     Has the prescription been filled recently? No   Is the patient out of the medication? Yes  Yes  Can we respond through MyChart? Yes   Agent: Please be advised that Rx refills may take up to 3 business days. We ask that you follow-up with your pharmacy.

## 2023-05-05 NOTE — Telephone Encounter (Signed)
Refill sent today to pharmacy

## 2023-05-05 NOTE — Telephone Encounter (Signed)
 Courtesy refill given, appointment needed.   Requested Prescriptions  Pending Prescriptions Disp Refills   amLODipine  (NORVASC ) 10 MG tablet 30 tablet 0    Sig: TAKE 1 TABLET(10 MG) BY MOUTH DAILY     Cardiovascular: Calcium  Channel Blockers 2 Failed - 05/05/2023  2:51 PM      Failed - Valid encounter within last 6 months    Recent Outpatient Visits           6 months ago Annual physical exam   [facility], MD   1 year ago Essential hypertension   [place] [facility], MD   1 year ago Urine frequency   [place] [facility] MD   1 year ago Nocturia   [place] [facility], MD   2 years ago Obstructive sleep apnea   [place] [facility] [place], MD              Passed - Last BP in normal range    BP Readings from Last 1 Encounters:  04/29/23 118/81         Passed - Last Heart Rate in normal range    Pulse Readings from Last 1 Encounters:  04/29/23 73

## 2023-05-05 NOTE — Telephone Encounter (Signed)
 Patient needs appointment.

## 2023-05-05 NOTE — Telephone Encounter (Signed)
 Patient called, left VM to return the call to the office to schedule an OV for follow up. Sent a [facility] geologist.

## 2023-05-12 ENCOUNTER — Ambulatory Visit: Payer: Self-pay

## 2023-05-12 ENCOUNTER — Ambulatory Visit (INDEPENDENT_AMBULATORY_CARE_PROVIDER_SITE_OTHER): Payer: Self-pay | Admitting: Family

## 2023-05-12 ENCOUNTER — Encounter: Payer: Self-pay | Admitting: *Deleted

## 2023-05-12 VITALS — BP 146/82 | HR 86 | Temp 97.7°F | Ht 64.0 in | Wt 220.0 lb

## 2023-05-12 DIAGNOSIS — I10 Essential (primary) hypertension: Secondary | ICD-10-CM

## 2023-05-12 DIAGNOSIS — E785 Hyperlipidemia, unspecified: Secondary | ICD-10-CM

## 2023-05-12 MED ORDER — ATORVASTATIN CALCIUM 40 MG PO TABS: ORAL_TABLET | ORAL | 0 refills | Status: AC

## 2023-05-12 MED ORDER — HYDROCHLOROTHIAZIDE 25 MG PO TABS: ORAL_TABLET | ORAL | 0 refills | Status: AC

## 2023-05-12 MED ORDER — AMLODIPINE BESYLATE 10 MG PO TABS: ORAL_TABLET | ORAL | 0 refills | Status: AC

## 2023-05-12 NOTE — Telephone Encounter (Signed)
 This encounter was created in error - please disregard. See previous encounter for medication .

## 2023-05-12 NOTE — Progress Notes (Signed)
 Patient ID: Francis Coleman, male    DOB: [birth year]  MRN: [id number]  CC: Chronic Conditions Follow-Up  Subjective: Francis Coleman is a 63 y.o. male who presents for chronic conditions follow-up.   His concerns today include:  - Doing well on Amlodipine  and Hydrochlorothiazide , no issues/concerns. States he has not taken blood pressure medications for today as of present. States he ate a bacon and egg biscuit for breakfast. He does not complain of red flag symptoms such as but not limited to chest pain, shortness of breath, worst headache of life, nausea/vomiting.  - Doing well on Atorvastatin , no issues/concerns.  Patient Active Problem List   Diagnosis Date Noted   Cough 04/29/2023   Hx of repair of left rotator cuff 11/03/2022   Complete tear of left rotator cuff 05/13/2022   Other spondylosis with radiculopathy, cervical region 01/28/2022   Impingement syndrome of left shoulder 01/28/2022   OSA (obstructive sleep apnea) 01/25/2020   Fatigue 01/11/2020   Essential hypertension 11/09/2019   Hyperlipidemia 11/09/2019   Erectile dysfunction 09/14/2019     Current Outpatient Medications on File Prior to Visit  Medication Sig Dispense Refill   aspirin  (EC-81 ASPIRIN ) 81 MG EC tablet Take 1 tablet (81 mg total) by mouth daily. Swallow whole. 30 tablet 12   doxycycline  (VIBRAMYCIN ) 100 MG capsule Take 1 capsule (100 mg total) by mouth 2 (two) times daily. 20 capsule 0   sildenafil  (REVATIO ) 20 MG tablet Take 1 tablet (20 mg total) by mouth daily as needed. 30 tablet 0   tiZANidine (ZANAFLEX) 4 MG tablet Take 4 mg by mouth 2 (two) times daily.     amoxicillin -clavulanate (AUGMENTIN ) 875-125 MG tablet Take 1 tablet by mouth every 12 (twelve) hours. 14 tablet 0   azithromycin  (ZITHROMAX ) 250 MG tablet Take first 2 tablets together, then 1 every day until finished. 6 tablet 0   guaiFENesin  (ROBITUSSIN) 100 MG/5ML liquid Take 5-10 mLs (100-200 mg total) by mouth every 4 (four) hours as  needed for cough or to loosen phlegm. (Patient not taking: Reported on 05/12/2023) 60 mL 0   naproxen  (NAPROSYN ) 500 MG tablet Take 1 tablet (500 mg total) by mouth 2 (two) times daily. 30 tablet 0   traMADol  (ULTRAM ) 50 MG tablet Take 1 tablet (50 mg total) by mouth every 6 (six) hours as needed. (Patient not taking: Reported on 09/09/2022) 30 tablet 0   No current facility-administered medications on file prior to visit.    Allergies  Allergen Reactions   Ace Inhibitors Swelling    Social History   Socioeconomic History   Marital status: Widowed    Spouse name: Not on file   Number of children: Not on file   Years of education: Not on file   Highest education level: 12th grade  Occupational History   Not on file  Tobacco Use   Smoking status: Former    Current packs/day: 0.00    Average packs/day: 0.3 packs/day for 2.0 years (0.5 ttl pk-yrs)    Types: Cigarettes    Start date: 20    Quit date: 1999    Years since quitting: 26.0   Smokeless tobacco: Never  Vaping Use   Vaping status: Never Used  Substance and Sexual Activity   Alcohol use: Yes    Comment: socially   Drug use: No   Sexual activity: Not Currently  Other Topics Concern   Not on file  Social History Narrative   Not on file   Social  Drivers of Health   Financial Resource Strain: Medium Risk (08/17/2022)   Overall Financial Resource Strain (CARDIA)    Difficulty of Paying Living Expenses: Somewhat hard  Food Insecurity: Food Insecurity Present (08/17/2022)   Hunger Vital Sign    Worried About Running Out of Food in the Last Year: Sometimes true    Ran Out of Food in the Last Year: Sometimes true  Transportation Needs: Unmet Transportation Needs (05/12/2023)   PRAPARE - Transportation    Lack of Transportation (Medical): No    Lack of Transportation (Non-Medical): Yes  Physical Activity: Insufficiently Active (08/17/2022)   Exercise Vital Sign    Days of Exercise per Week: 2 days    Minutes of  Exercise per Session: 50 min  Stress: No Stress Concern Present (08/17/2022)   [employer] of Occupational Health - Occupational Stress Questionnaire    Feeling of Stress : Only a little  Social Connections: Unknown (08/17/2022)   Social Connection and Isolation Panel [NHANES]    Frequency of Communication with Friends and Family: Twice a week    Frequency of Social Gatherings with Friends and Family: Not on file    Attends Religious Services: Not on file    Active Member of Clubs or Organizations: No    Attends Banker Meetings: Not on file    Marital Status: Widowed  Intimate Partner Violence: Unknown (07/30/2021)   Received from [facility]   HITS    Physically Hurt: Not on file    Insult or Talk Down To: Not on file    Threaten Physical Harm: Not on file    Scream or Curse: Not on file    Family History  Problem Relation Age of Onset   Colon cancer Neg Hx    Colon polyps Neg Hx    Esophageal cancer Neg Hx    Stomach cancer Neg Hx    Rectal cancer Neg Hx     Past Surgical History:  Procedure Laterality Date   CIRCUMCISION     COLON SURGERY N/A    [employer] 02/12/2020   COLONOSCOPY WITH PROPOFOL  N/A 09/03/2014   Procedure: COLONOSCOPY WITH PROPOFOL ;  Surgeon: Garrett Kallman, MD;  Location: [facility];  Service: Endoscopy;  Laterality: N/A;    ROS: Review of Systems Negative except as stated above  PHYSICAL EXAM: BP (!) 146/82   Pulse 86   Temp 97.7 F (36.5 C) (Oral)   Ht 5\' 4"  (1.626 m)   Wt 220 lb (99.8 kg)   SpO2 97%   BMI 37.76 kg/m   Physical Exam HENT:     Head: Normocephalic and atraumatic.     Nose: Nose normal.     Mouth/Throat:     Mouth: Mucous membranes are moist.     Pharynx: Oropharynx is clear.  Eyes:     Extraocular Movements: Extraocular movements intact.     Conjunctiva/sclera: Conjunctivae normal.     Pupils: Pupils are equal, round, and reactive to light.  Cardiovascular:     Rate and Rhythm:  Normal rate and regular rhythm.     Pulses: Normal pulses.     Heart sounds: Normal heart sounds.  Pulmonary:     Effort: Pulmonary effort is normal.     Breath sounds: Normal breath sounds.  Musculoskeletal:        General: Normal range of motion.     Cervical back: Normal range of motion and neck supple.  Neurological:     General: No focal deficit  present.     Mental Status: He is alert and oriented to person, place, and time.  Psychiatric:        Mood and Affect: Mood normal.        Behavior: Behavior normal.     ASSESSMENT AND PLAN: 1. Primary hypertension (Primary) - Blood pressure not at goal during today's visit. Patient asymptomatic without chest pressure, chest pain, palpitations, shortness of breath, worst headache of life, and any additional red flag symptoms. - Patient states he has not taken blood pressure medications for today as of present. - Continue Amlodipine  and Hydrochlorothiazide  as prescribed.  - Counseled on blood pressure goal of less than 130/80, low-sodium, DASH diet, medication compliance, and 150 minutes of moderate intensity exercise per week as tolerated. Counseled on medication adherence and adverse effects. - Follow-up with primary provider in 4 weeks or sooner if needed.  - amLODipine  (NORVASC ) 10 MG tablet; TAKE 1 TABLET(10 MG) BY MOUTH DAILY.  Dispense: 90 tablet; Refill: 0 - hydrochlorothiazide  (HYDRODIURIL ) 25 MG tablet; TAKE 1 TABLET(25 MG) BY MOUTH DAILY  Dispense: 90 tablet; Refill: 0  2. Hyperlipidemia, unspecified hyperlipidemia type - Continue Atorvastatin  as prescribed. Counseled on medication adherence/adverse effects. - Follow-up with primary provider in 3 months or sooner if needed.  - atorvastatin  (LIPITOR) 40 MG tablet; TAKE 1 TABLET(40 MG) BY MOUTH DAILY  Dispense: 90 tablet; Refill: 0   Patient was given the opportunity to ask questions.  Patient verbalized understanding of the plan and was able to repeat key elements of the plan.  Patient was given clear instructions to go to Emergency Department or return to medical center if symptoms don't improve, worsen, or new problems develop.The patient verbalized understanding.    Requested Prescriptions   Signed Prescriptions Disp Refills   amLODipine  (NORVASC ) 10 MG tablet 90 tablet 0    Sig: TAKE 1 TABLET(10 MG) BY MOUTH DAILY.   atorvastatin  (LIPITOR) 40 MG tablet 90 tablet 0    Sig: TAKE 1 TABLET(40 MG) BY MOUTH DAILY   hydrochlorothiazide  (HYDRODIURIL ) 25 MG tablet 90 tablet 0    Sig: TAKE 1 TABLET(25 MG) BY MOUTH DAILY    Return in 4 weeks (on 06/09/2023) for Follow-Up or next available chronic conditions with Abraham Abo, MD .  Senaida Dama, NP

## 2023-05-12 NOTE — Telephone Encounter (Signed)
 Patient called, left VM to return the call to the office to speak to the NT. Pt had OV today with Amy, NP requested refill but don't see where that was sent in.   Summary: med increase   Patient called stated he needs an increase in sildenafil  (REVATIO ) 20 MG tablet and refill sent to [facility][id number] [place], [place] - [address] AT [facility] OF [address] Phone: [phone] Fax: [phone]

## 2023-05-12 NOTE — Progress Notes (Signed)
 Patient states he needs atorvastatin  and sildenafil  refilled.

## 2023-05-12 NOTE — Telephone Encounter (Signed)
  Chief Complaint: requesting increase dose of sildenafil  (REVATIO ) 20 mg. Forgot to requesting during OV today  Symptoms: requesting increase dose  Frequency: na Pertinent Negatives: Patient denies na Disposition: [] ED /[] Urgent Care (no appt availability in office) / [] Appointment(In office/virtual)/ []  [facility]/ [] Home Care/ [] Refused Recommended Disposition /[] [place] [facility]/ [x]  Follow-up with PCP Additional Notes:   See above . Patient requesting a call back when medication refill sent to pharmacy      Reason for Disposition  [1] Caller has NON-URGENT medicine question about med that PCP prescribed AND [2] triager unable to answer question  Answer Assessment - Initial Assessment Questions 1. NAME of MEDICINE: "What medicine(s) are you calling about?"     Sildenafil  ( REVATIO ) 20 mg  2. QUESTION: "What is your question?" (e.g., double dose of medicine, side effect)     Can dose be increased? 3. PRESCRIBER: "Who prescribed the medicine?" Reason: if prescribed by specialist, call should be referred to that group.     PCP 4. SYMPTOMS: "Do you have any symptoms?" If Yes, ask: "What symptoms are you having?"  "How bad are the symptoms (e.g., mild, moderate, severe)     Requesting increase in dose  5. PREGNANCY:  "Is there any chance that you are pregnant?" "When was your last menstrual period?"     na  Protocols used: Medication Question Call-A-AH

## 2023-05-19 NOTE — Telephone Encounter (Signed)
I called and spoke with patient and made of aware of MD Wilson recommendations.  There is no increase dosing beyond 2-5 po prior to intercourse if using instead of viagra

## 2023-05-21 ENCOUNTER — Other Ambulatory Visit: Payer: Self-pay | Admitting: Family Medicine

## 2023-05-21 NOTE — Telephone Encounter (Unsigned)
Copied from CRM [id number]. Topic: Clinical - Medication Refill >> May 21, 2023 12:37 PM Dondra Prader A wrote: Most Recent Primary Care Visit:  Provider: Rema Fendt  Department: [facility]  Visit Type: OFFICE VISIT  Date: 05/12/2023  Medication: sildenafil (REVATIO) 20 MG tablet  Has the patient contacted their pharmacy? Yes  Is this the correct pharmacy for this prescription? Yes This is the patient's preferred pharmacy:   [facility] #[id number] [place], [place] - [address] AT [facility] OF [facility] [address] [place] [address] Phone: [phone] Fax: [phone]   Has the prescription been filled recently? No  Is the patient out of the medication? Yes  Has the patient been seen for an appointment in the last year OR does the patient have an upcoming appointment? Yes  Can we respond through MyChart? Yes  Agent: Please be advised that Rx refills may take up to 3 business days. We ask that you follow-up with your pharmacy.

## 2023-05-25 MED ORDER — SILDENAFIL CITRATE 20 MG PO TABS: 20.0000 mg | ORAL_TABLET | Freq: Every day | ORAL | 0 refills | Status: AC | PRN

## 2023-06-09 ENCOUNTER — Ambulatory Visit: Payer: Medicaid Other | Admitting: Family Medicine

## 2023-06-14 ENCOUNTER — Other Ambulatory Visit: Payer: Self-pay | Admitting: Family Medicine

## 2023-06-14 DIAGNOSIS — I10 Essential (primary) hypertension: Secondary | ICD-10-CM

## 2023-06-14 NOTE — Telephone Encounter (Signed)
Requested by interface surescripts. Requested too soon. Last refill 05/12/23 #90 .  Requested Prescriptions  Refused Prescriptions Disp Refills   amLODipine (NORVASC) 10 MG tablet 90 tablet 0    Sig: TAKE 1 TABLET(10 MG) BY MOUTH DAILY.     Cardiovascular: Calcium Channel Blockers 2 Failed - 06/14/2023  3:55 PM      Failed - Last BP in normal range    BP Readings from Last 1 Encounters:  05/12/23 (!) 146/82         Passed - Last Heart Rate in normal range    Pulse Readings from Last 1 Encounters:  05/12/23 86         Passed - Valid encounter within last 6 months    Recent Outpatient Visits           1 month ago Primary hypertension   [place] [facility] [place], NP   8 months ago Annual physical exam   [place] [facility], MD   1 year ago Essential hypertension   [place] [facility], MD   1 year ago Urine frequency   [place] [facility], MD   2 years ago Nocturia   [place] [facility], MD

## 2023-06-14 NOTE — Telephone Encounter (Signed)
Copied from CRM [id number]. Topic: Clinical - Medication Refill >> Jun 14, 2023 11:22 AM Patsy Lager T wrote: Most Recent Primary Care Visit:  Provider: Ricky Stabs J  Department: [facility]  Visit Type: OFFICE VISIT  Date: 05/12/2023  Medication:  amLODipine (NORVASC) 10 MG tablet    Has the patient contacted their pharmacy? No  Is this the correct pharmacy for this prescription? Yes   This is the patient's preferred pharmacy:  [facility] #[id number] [place], [place] - [address] AT [facility] OF [facility] [address] [place] [address] Phone: [phone] Fax: [phone]  Has the prescription been filled recently? Yes  Is the patient out of the medication? Yes  Has the patient been seen for an appointment in the last year OR does the patient have an upcoming appointment? No  Can we respond through MyChart? No  Agent: Please be advised that Rx refills may take up to 3 business days. We ask that you follow-up with your pharmacy.

## 2023-07-05 NOTE — Telephone Encounter (Unsigned)
 Copied from CRM [id number]. Topic: Clinical - Prescription Issue >> Jul 05, 2023 11:59 AM Fonda Kinder J wrote: Reason for CRM: Pt states pharmacy only filled a 30 day supply for amLODipine (NORVASC) 10 MG tablet. Pt states he is out of medication and his refill request was refused. Please advise

## 2023-09-06 ENCOUNTER — Ambulatory Visit (INDEPENDENT_AMBULATORY_CARE_PROVIDER_SITE_OTHER): Admitting: Podiatry

## 2023-09-06 ENCOUNTER — Ambulatory Visit (INDEPENDENT_AMBULATORY_CARE_PROVIDER_SITE_OTHER)

## 2023-09-06 ENCOUNTER — Encounter: Payer: Self-pay | Admitting: Podiatry

## 2023-09-06 DIAGNOSIS — M7752 Other enthesopathy of left foot: Secondary | ICD-10-CM

## 2023-09-06 DIAGNOSIS — D492 Neoplasm of unspecified behavior of bone, soft tissue, and skin: Secondary | ICD-10-CM | POA: Diagnosis not present

## 2023-09-06 MED ORDER — TRIAMCINOLONE ACETONIDE 10 MG/ML IJ SUSP
10.0000 mg | Freq: Once | INTRAMUSCULAR | Status: AC
  Administered 2023-09-06: 10 mg via INTRA_ARTICULAR

## 2023-09-08 ENCOUNTER — Other Ambulatory Visit: Payer: Self-pay | Admitting: Family

## 2023-09-08 DIAGNOSIS — E785 Hyperlipidemia, unspecified: Secondary | ICD-10-CM

## 2023-09-08 DIAGNOSIS — I10 Essential (primary) hypertension: Secondary | ICD-10-CM

## 2023-09-09 NOTE — Progress Notes (Signed)
 Subjective:   Patient ID: Francis Coleman, male   DOB: 63 y.o.   MRN: [id number]   HPI Patient presents with pain in the left ankle that is been going on for around a year and chronic lesion plantar left within the midfoot lateral side measuring about a centimeter that has been painful and he has tried over-the-counter medicines.  Patient does not smoke tries to be active   Review of Systems  All other systems reviewed and are negative.       Objective:  Physical Exam Vitals and nursing note reviewed.  Constitutional:      Appearance: He is well-developed.  Pulmonary:     Effort: Pulmonary effort is normal.  Musculoskeletal:        General: Normal range of motion.  Skin:    General: Skin is warm.  Neurological:     Mental Status: He is alert.     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

## 2024-02-28 ENCOUNTER — Encounter: Payer: Self-pay | Admitting: Radiology

## 2024-03-19 ENCOUNTER — Emergency Department (HOSPITAL_COMMUNITY)
Admission: EM | Admit: 2024-03-19 | Discharge: 2024-03-19 | Disposition: A | Discharge status: Home / Self Care | Attending: Emergency Medicine | Admitting: Emergency Medicine

## 2024-03-19 ENCOUNTER — Other Ambulatory Visit: Payer: Self-pay

## 2024-03-19 ENCOUNTER — Encounter (HOSPITAL_COMMUNITY): Payer: Self-pay

## 2024-03-19 DIAGNOSIS — S0591XA Unspecified injury of right eye and orbit, initial encounter: Secondary | ICD-10-CM | POA: Diagnosis present

## 2024-03-19 DIAGNOSIS — S0502XA Injury of conjunctiva and corneal abrasion without foreign body, left eye, initial encounter: Secondary | ICD-10-CM | POA: Insufficient documentation

## 2024-03-19 DIAGNOSIS — Z7982 Long term (current) use of aspirin: Secondary | ICD-10-CM | POA: Diagnosis not present

## 2024-03-19 DIAGNOSIS — X58XXXA Exposure to other specified factors, initial encounter: Secondary | ICD-10-CM | POA: Diagnosis not present

## 2024-03-19 MED ORDER — FLUORESCEIN SODIUM 1 MG OP STRP
1.0000 | ORAL_STRIP | Freq: Once | OPHTHALMIC | Status: AC
  Administered 2024-03-19: 1 via OPHTHALMIC
  Filled 2024-03-19: qty 1

## 2024-03-19 MED ORDER — POLYMYXIN B-TRIMETHOPRIM 10000-0.1 UNIT/ML-% OP SOLN: 2.0000 [drp] | OPHTHALMIC | 0 refills | Status: AC

## 2024-03-19 MED ORDER — TETRACAINE HCL 0.5 % OP SOLN
2.0000 [drp] | Freq: Once | OPHTHALMIC | Status: AC
  Administered 2024-03-19: 2 [drp] via OPHTHALMIC
  Filled 2024-03-19: qty 4

## 2024-03-19 NOTE — ED Provider Notes (Signed)
 [place] EMERGENCY DEPARTMENT AT [facility] Provider Note   CSN: [id number] Arrival date & time: 03/19/24  9188     Patient presents with: Eye Injury   Francis Coleman is a 63 y.o. male who presents to the ED today with concern of left eye foreign body sensation.  Was helping his son cut open a can with a knife, felt something fly into his left eye, events occurred yesterday evening.  Has since had excessive tearing in the left eye, as well as burning sensation and feeling of a object being stuck in the left eye.  Also endorses blurred vision of the left eye.    Eye Injury       Prior to Admission medications   Medication Sig Start Date End Date Taking? Authorizing Provider  trimethoprim -polymyxin b  (POLYTRIM ) ophthalmic solution Place 2 drops into the left eye every 4 (four) hours. 03/19/24  Yes Myriam Dorn BROCKS, PA  amLODipine  (NORVASC ) 10 MG tablet TAKE 1 TABLET(10 MG) BY MOUTH DAILY. 05/12/23   Jaycee Greig JINNY, NP  amoxicillin -clavulanate (AUGMENTIN ) 875-125 MG tablet Take 1 tablet by mouth every 12 (twelve) hours. 03/21/23   Roemhildt, Lorin T, PA-C  aspirin  (EC-81 ASPIRIN ) 81 MG EC tablet Take 1 tablet (81 mg total) by mouth daily. Swallow whole. 02/16/20   Prentiss Dorothyann Maxwell, MD  atorvastatin  (LIPITOR) 40 MG tablet TAKE 1 TABLET(40 MG) BY MOUTH DAILY 05/12/23   Jaycee Greig JINNY, NP  azithromycin  (ZITHROMAX ) 250 MG tablet Take first 2 tablets together, then 1 every day until finished. 03/21/23   Roemhildt, Lorin T, PA-C  doxycycline  (VIBRAMYCIN ) 100 MG capsule Take 1 capsule (100 mg total) by mouth 2 (two) times daily. 04/29/23   Flint Sonny POUR, PA-C  guaiFENesin  (ROBITUSSIN) 100 MG/5ML liquid Take 5-10 mLs (100-200 mg total) by mouth every 4 (four) hours as needed for cough or to loosen phlegm. 03/21/23   Roemhildt, Lorin T, PA-C  hydrochlorothiazide  (HYDRODIURIL ) 25 MG tablet TAKE 1 TABLET(25 MG) BY MOUTH DAILY 05/12/23   Jaycee Greig JINNY, NP  naproxen  (NAPROSYN )  500 MG tablet Take 1 tablet (500 mg total) by mouth 2 (two) times daily. 04/03/23   Lorelle Aleck BROCKS, PA-C  sildenafil  (REVATIO ) 20 MG tablet Take 1 tablet (20 mg total) by mouth daily as needed. 05/25/23   Tanda Bleacher, MD  tiZANidine (ZANAFLEX) 4 MG tablet Take 4 mg by mouth 2 (two) times daily. 01/05/22   [provider]  traMADol  (ULTRAM ) 50 MG tablet Take 1 tablet (50 mg total) by mouth every 6 (six) hours as needed. Patient not taking: Reported on 09/09/2022 07/14/22   Barbarann Oneil BROCKS, MD    Allergies: Ace inhibitors    Review of Systems  Eyes:  Positive for pain and itching.  All other systems reviewed and are negative.   Updated Vital Signs BP (!) 139/95   Pulse 69   Temp 97.8 F (36.6 C)   Resp 17   SpO2 100%   Physical Exam Vitals and nursing note reviewed.  Constitutional:      General: He is not in acute distress.    Appearance: Normal appearance.  HENT:     Head: Normocephalic and atraumatic.     Mouth/Throat:     Mouth: Mucous membranes are moist.     Pharynx: Oropharynx is clear.  Eyes:     General: Lids are normal. Lids are everted, no foreign bodies appreciated. Gaze aligned appropriately.        Right eye:  No foreign body.        Left eye: No foreign body.     Extraocular Movements: Extraocular movements intact.     Conjunctiva/sclera:     Right eye: Right conjunctiva is not injected. No chemosis, exudate or hemorrhage.    Left eye: Left conjunctiva is injected. No chemosis, exudate or hemorrhage.    Pupils: Pupils are equal, round, and reactive to light.     Comments: Fluorescein  stain shows a corneal abrasion on the medial aspect of the left eye.  Does not cross the limbus.  Cardiovascular:     Rate and Rhythm: Normal rate and regular rhythm.     Pulses: Normal pulses.     Heart sounds: Normal heart sounds. No murmur heard.    No friction rub. No gallop.  Pulmonary:     Effort: Pulmonary effort is normal.     Breath sounds: Normal breath  sounds.  Abdominal:     General: Abdomen is flat. Bowel sounds are normal.     Palpations: Abdomen is soft.  Musculoskeletal:        General: Normal range of motion.     Cervical back: Normal range of motion and neck supple.     Right lower leg: No edema.     Left lower leg: No edema.  Skin:    General: Skin is warm and dry.     Capillary Refill: Capillary refill takes less than 2 seconds.  Neurological:     General: No focal deficit present.     Mental Status: He is alert and oriented to person, place, and time. Mental status is at baseline.     GCS: GCS eye subscore is 4. GCS verbal subscore is 5. GCS motor subscore is 6.  Psychiatric:        Mood and Affect: Mood normal.     (all labs ordered are listed, but only abnormal results are displayed) Labs Reviewed - No data to display  EKG: None  Radiology: No results found.   Procedures   Medications Ordered in the ED  fluorescein  ophthalmic strip 1 strip (has no administration in time range)  tetracaine  (PONTOCAINE) 0.5 % ophthalmic solution 2 drop (has no administration in time range)                                    Medical Decision Making Risk Prescription drug management.   Physical exam of this patient does not reveal an overt foreign body, fluorescein  stain was performed which does show a corneal abrasion at the medial aspect of the eye that does not cross the limbus.  There is excessive tearing noted from the left eye, otherwise no purulent drainage, mild conjunctival injection, no chemosis.  Again no overt foreign bodies noted on the eye.  Will manage with antibiotic eyedrops, provide referral for ophthalmology should he have continued pain despite current management.  Return precautions given to patient, he understands and agrees has no further concerns at this time.     Final diagnoses:  Abrasion of left cornea, initial encounter    ED Discharge Orders          Ordered    trimethoprim -polymyxin b   (POLYTRIM ) ophthalmic solution  Every 4 hours        03/19/24 0913               Myriam Dorn BROCKS, PA 03/19/24 0914    Horton,  Roxie HERO, DO 03/23/24 1257

## 2024-03-19 NOTE — Discharge Instructions (Addendum)
 Use antibiotic drops as prescribed, if your condition worsens despite using these drops I would recommend that you follow-up with the referred ophthalmologist for further eye exam.

## 2024-03-19 NOTE — ED Triage Notes (Signed)
 Pt arrives via POV. Pt states last night, he was trying to open a can with a knife since he didn't have a can opener. PT reports when he was opening the can, he felt something fly up into his left eye. Pt reports pain, irritation, and drainage from left eye since this morning. States vision in left eye is blurry. Pt is AxOx4.
